# Patient Record
Sex: Female | Born: 1975 | Race: White | Hispanic: No | Marital: Married | Smoking: Former smoker
Health system: Southern US, Community
[De-identification: ages and names within clinical notes are randomized; demographics above are authoritative.]

## PROBLEM LIST (undated history)

## (undated) DIAGNOSIS — F419 Anxiety disorder, unspecified: Secondary | ICD-10-CM

## (undated) DIAGNOSIS — O24419 Gestational diabetes mellitus in pregnancy, unspecified control: Secondary | ICD-10-CM

## (undated) DIAGNOSIS — O09529 Supervision of elderly multigravida, unspecified trimester: Secondary | ICD-10-CM

## (undated) DIAGNOSIS — M329 Systemic lupus erythematosus, unspecified: Secondary | ICD-10-CM

## (undated) DIAGNOSIS — IMO0002 Reserved for concepts with insufficient information to code with codable children: Secondary | ICD-10-CM

## (undated) DIAGNOSIS — I10 Essential (primary) hypertension: Secondary | ICD-10-CM

## (undated) HISTORY — DX: Gestational diabetes mellitus in pregnancy, unspecified control: O24.419

## (undated) HISTORY — PX: NO PAST SURGERIES: SHX2092

## (undated) HISTORY — PX: WISDOM TOOTH EXTRACTION: SHX21

---

## 2009-03-20 ENCOUNTER — Ambulatory Visit: Payer: Self-pay | Admitting: General Practice

## 2009-05-16 ENCOUNTER — Ambulatory Visit: Payer: Self-pay | Admitting: General Practice

## 2010-06-05 ENCOUNTER — Inpatient Hospital Stay: Payer: Self-pay | Admitting: Obstetrics and Gynecology

## 2014-07-27 DIAGNOSIS — O09529 Supervision of elderly multigravida, unspecified trimester: Secondary | ICD-10-CM

## 2014-07-27 HISTORY — DX: Supervision of elderly multigravida, unspecified trimester: O09.529

## 2015-02-18 LAB — OB RESULTS CONSOLE RPR: RPR: NONREACTIVE

## 2015-02-18 LAB — OB RESULTS CONSOLE GC/CHLAMYDIA
Chlamydia: NEGATIVE
Gonorrhea: NEGATIVE

## 2015-02-18 LAB — OB RESULTS CONSOLE HEPATITIS B SURFACE ANTIGEN: HEP B S AG: NEGATIVE

## 2015-02-18 LAB — OB RESULTS CONSOLE VARICELLA ZOSTER ANTIBODY, IGG: VARICELLA IGG: IMMUNE

## 2015-02-18 LAB — OB RESULTS CONSOLE HIV ANTIBODY (ROUTINE TESTING): HIV: NONREACTIVE

## 2015-02-18 LAB — OB RESULTS CONSOLE RUBELLA ANTIBODY, IGM: RUBELLA: IMMUNE

## 2015-05-10 ENCOUNTER — Other Ambulatory Visit: Payer: Self-pay | Admitting: Certified Nurse Midwife

## 2015-05-10 DIAGNOSIS — O280 Abnormal hematological finding on antenatal screening of mother: Secondary | ICD-10-CM

## 2015-05-16 ENCOUNTER — Ambulatory Visit
Admission: RE | Admit: 2015-05-16 | Discharge: 2015-05-16 | Disposition: A | Discharge status: Home / Self Care | Payer: BLUE CROSS/BLUE SHIELD | Source: Ambulatory Visit | Attending: Maternal & Fetal Medicine | Admitting: Maternal & Fetal Medicine

## 2015-05-16 ENCOUNTER — Ambulatory Visit
Admission: RE | Admit: 2015-05-16 | Discharge: 2015-05-16 | Disposition: A | Discharge status: Home / Self Care | Payer: BLUE CROSS/BLUE SHIELD | Source: Ambulatory Visit | Attending: Certified Nurse Midwife | Admitting: Certified Nurse Midwife

## 2015-05-16 DIAGNOSIS — O289 Unspecified abnormal findings on antenatal screening of mother: Secondary | ICD-10-CM | POA: Diagnosis present

## 2015-05-16 DIAGNOSIS — Z3A18 18 weeks gestation of pregnancy: Secondary | ICD-10-CM | POA: Insufficient documentation

## 2015-05-16 DIAGNOSIS — O280 Abnormal hematological finding on antenatal screening of mother: Secondary | ICD-10-CM

## 2015-05-16 HISTORY — DX: Systemic lupus erythematosus, unspecified: M32.9

## 2015-05-16 HISTORY — DX: Supervision of elderly multigravida, unspecified trimester: O09.529

## 2015-05-16 HISTORY — DX: Reserved for concepts with insufficient information to code with codable children: IMO0002

## 2015-05-16 HISTORY — DX: Anxiety disorder, unspecified: F41.9

## 2015-05-16 NOTE — Progress Notes (Signed)
Referring Provider:  [facility] Length of consultation 25 minutes  [name] was referred for genetic counseling to discuss the results of her Maternal Serum Screen and her prenatal testing options.  This note is a summary of our discussion.  To review, a Maternal Serum AFP is a maternal blood test that measures maternal serum AFP levels to determine if a pregnancy is at higher risk for certain birth defects or problems; however, it cannot diagnose or rule out these conditions.  It is important to remember that an abnormal maternal serum screen (MSS) test does not necessarily mean that the baby has a problem.  Maternal serum screening can identify approximately 80% of open neural tube defects (i.e., spina bifida).  The neural tube consists of the fetal head and spine, and if this structure fails to close during development, spina bifida (open spine) or anencephaly (open skull) could result.   The MSS result revealed an increased level of AFP.  The value was 2.67 MOM (multiples of the median) which is above the cutoff of 2.50 MOM.  Increased levels of AFP occur for many reasons including: inaccurate pregnancy dating, the presence of twins, pregnancy complications, neural tube defects, abdominal wall defects or a normal pregnancy.  Before MSS screening, her risk to have a child with a neural tube defect was 1 or 2 per 1000. Given the elevated AFP value, the risk is now 1 in 209.  This means that the chance that this baby does not have a neural tube defect is >99.5%.  We discussed the following prenatal testing options for this pregnancy:   Ultrasound was recommended to evaluate the fetal anatomy, particularly the neural tube and to confirm the gestational age.  An ultrasound performed on 04/14/12 revealed that the patient was 18 weeks and 2 days pregnant.  There was no evidence of a neural tube defect on this ultrasound.  The fetal anatomy appeared normal, although this cannot rule out all  abnormalities.  Redraw AFP is offered when the level of AFP MoM does not exceed a particular laboratory cutoff.  Redrawing maternal blood for a repeat can provide patients with a better assessment of their risk to have a child with a neural tube defect.  Amniocentesis was offered because the AFP MoM exceeded a particular laboratory cutoff.  Amniocentesis can detect greater than 98% of neural tube defects.  It involves the removal of a small amount of amniotic fluid from the sac surrounding the fetus with the use of a thin needle through the maternal abdomen and uterus.  Fetal cells are directly evaluated and approximately 99% of chromosome conditions may also be detected.  Ultrasound guidance is used throughout the procedure. The main risks to this procedure include complications leading to miscarriage in less than 1 in 200 cases (0.5%).    It is important to remember that each pregnancy in the general population has a 2-3% chance for a birth defect that might not be detected prenatally.  We recommend 0.4mg  of folic acid, a B-complex vitamin, for all women of childbearing age.  Folic acid may help to prevent neural tube defects, and should be taken prior to and during pregnancy.  We inquired about the family history, and the patient reported no family members with known genetic conditions, mental retardation or birth defects.  In addition, [name]  reported no complications thus far and deny the use of prescription medications, recreational drugs, tobacco or alcohol. Of note, the patient is also of advanced maternal age (39  years old).  She had prior cell free fetal DNA testing in this pregnancy that was normal, showing no increased risk for trisomy 13, 18 or 21.  We reviewed these results; the patient had no further questions regarding aneuploidy screening or testing.  After careful consideration, [name] elected to have an ultrasound only and to decline additional screening and testing  options.  Unexplained elevations of maternal serum AFP have been associated with adverse pregnancy outcomes such as low birth weight, preterm labor, pre-eclampsia or fetal demise.  For this reason, another ultrasound should be considered at 26 to [redacted] weeks gestation to reassess fetal growth.  If further questions or concerns arise, please do not hesitate to call us at [phone].

## 2015-05-16 NOTE — Progress Notes (Signed)
Genetic counseling recommendations and assessment reviewed with [facility], I agree with her consultation as outlined.

## 2015-07-28 NOTE — L&D Delivery Note (Signed)
Deliver Note   Date of Delivery:   09/25/2015 Primary OB:   [facility] Gestational Age/EDD: [redacted]w[redacted]d by 10/15/2015, by Last Menstrual Period  Antepartum complications:  OB History    Gravida Para Term Preterm AB TAB SAB Ectopic Multiple Living   2 1 1       1       Delivered By:   Malachy Mood MD  Delivery Type:   TSVD Anesthesia:     Epidural  Intrapartum complications:  GBS:    Unknown Laceration:    None Episiotomy:    none Placenta:    Spontaneous Estimated Blood Loss:  218mL Baby:    Liveborn female , APGAR (1 MIN):   APGAR (5 MINS):   APGAR (10 MINS):  , weight     Deliver Details   At 06:19 a female infant was delivered via TSVD (Presentation: straight OA   ).  APGAR: 8, 9; weight  pending.   Placenta status: manual extraction, intact.  Cord: 3 vessel with the following complications: adherent placenta requiring manual extraction, intact on inspection, known circumvallate placenta on ultrasound, GDM .    Mom to postpartum.  Baby to Couplet care / Skin to Skin.

## 2015-08-15 ENCOUNTER — Encounter: Payer: BLUE CROSS/BLUE SHIELD | Attending: Obstetrics and Gynecology | Admitting: *Deleted

## 2015-08-15 ENCOUNTER — Encounter: Payer: Self-pay | Admitting: *Deleted

## 2015-08-15 VITALS — BP 108/58 | Ht 64.0 in | Wt 195.6 lb

## 2015-08-15 DIAGNOSIS — O24410 Gestational diabetes mellitus in pregnancy, diet controlled: Secondary | ICD-10-CM

## 2015-08-15 DIAGNOSIS — O24419 Gestational diabetes mellitus in pregnancy, unspecified control: Secondary | ICD-10-CM | POA: Insufficient documentation

## 2015-08-15 NOTE — Patient Instructions (Signed)
Read booklet on Gestational Diabetes Follow Gestational Meal Planning Guidelines Complete a 3 Day Food Record and bring to next appointment Avoid fruit juices and sugar sweetened drinks Check blood sugars 4 x day - before breakfast and 2 hrs after every meal and record  Bring blood sugar log to all appointments Purchase urine ketone strips if blood sugars not controlled and check urine ketones every am:  If + increase bedtime snack to 1 protein and 2 carbohydrate servings Walk 20-30 minutes at least 5 x week if permitted by MD Quit smoking

## 2015-08-16 NOTE — Progress Notes (Signed)
Diabetes Self-Management Education  Visit Type: First/Initial  Appt. Start Time: 1450  Appt. End Time: [id number]  08/16/2015  [name], identified by name and date of birth, is a 40 y.o. female with a diagnosis of Diabetes: Gestational Diabetes.   ASSESSMENT  Blood pressure 108/58, height 5\' 4"  (1.626 m), weight 195 lb 9.6 oz (88.724 kg), last menstrual period 01/08/2015. Body mass index is 33.56 kg/(m^2).      Diabetes Self-Management Education - 08/15/15 1713    Visit Information   Visit Type First/Initial   Initial Visit   Diabetes Type Gestational Diabetes   Are you currently following a meal plan? No   Are you taking your medications as prescribed? Yes   Date Diagnosed 08/08/15   Health Coping   How would you rate your overall health? Excellent   Psychosocial Assessment   Patient Belief/Attitude about Diabetes Motivated to manage diabetes  "I am not going to stress about it"   Self-care barriers None   Self-management support Doctor's office;Family   Patient Concerns Nutrition/Meal planning;Monitoring;Glycemic Control;Problem Solving   Special Needs None   Preferred Learning Style Auditory;Visual;Hands on   Learning Readiness Ready   How often do you need to have someone help you when you read instructions, pamphlets, or other written materials from your doctor or pharmacy? 1 - Never   What is the last grade level you completed in school? [id number]   Complications   How often do you check your blood sugar? 0 times/day (not testing)  Pt brought Accu-Chek Aviva Connect meter and was instructed on use. BG upon return demonstration was 82 mg/dL at 4:45 pm - 4 1/2 hrs pp.   Have you had a dilated eye exam in the past 12 months? Yes   Have you had a dental exam in the past 12 months? Yes   Are you checking your feet? No   Dietary Intake   Breakfast biscuit   Lunch sandwich, salads   Snack (afternoon) crackers, apple   Dinner meat and vegetables   Beverage(s) orange juice,  water, unsweetened drinks   Exercise   Exercise Type ADL's   Patient Education   Previous Diabetes Education No   Disease state  Definition of diabetes, type 1 and 2, and the diagnosis of diabetes   Nutrition management  Role of diet in the treatment of diabetes and the relationship between the three main macronutrients and blood glucose level   Physical activity and exercise  Role of exercise on diabetes management, blood pressure control and cardiac health.   Monitoring Taught/evaluated SMBG meter.;Purpose and frequency of SMBG.;Identified appropriate SMBG and/or A1C goals. Ketone testing, when, how.   Chronic complications Relationship between chronic complications and blood glucose control   Psychosocial adjustment Identified and addressed patients feelings and concerns about diabetes   Preconception care Pregnancy and GDM  Role of pre-pregnancy blood glucose control on the development of the fetus;Role of family planning for patients with diabetes;Reviewed with patient blood glucose goals with pregnancy   Personal strategies to promote health Review risk of smoking and offered smoking cessation   Individualized Goals (developed by patient)   Reducing Risk Improve blood sugars Prevent diabetess complications   Outcomes   Expected Outcomes Demonstrated interest in learning. Expect positive outcomes      Individualized Plan for Diabetes Self-Management Training:   Learning Objective:  Patient will have a greater understanding of diabetes self-management. Patient education plan is to attend individual and/or group sessions per assessed needs and concerns.  Plan:   Patient Instructions  Read booklet on Gestational Diabetes Follow Gestational Meal Planning Guidelines Complete a 3 Day Food Record and bring to next appointment Avoid fruit juices and sugar sweetened drinks Check blood sugars 4 x day - before breakfast and 2 hrs after every meal and record  Bring blood sugar log to all  appointments Purchase urine ketone strips if blood sugars not controlled and check urine ketones every am:  If + increase bedtime snack to 1 protein and 2 carbohydrate servings Walk 20-30 minutes at least 5 x week if permitted by MD Quit smoking  Expected Outcomes:  Demonstrated interest in learning. Expect positive outcomes  Education material provided:  Gestational Booklet Gestational Meal Planning Guidelines Viewed Gestational Diabetes Video 3 Day Food Record Goals for a Healthy Pregnancy  If problems or questions, patient to contact team via:   Johny Drilling, [place], [place], CDE [phone]  Future DSME appointment: Monday August 26, 2015 at 9:30 am with dietitian

## 2015-08-26 ENCOUNTER — Encounter: Payer: BLUE CROSS/BLUE SHIELD | Admitting: Dietician

## 2015-08-26 VITALS — BP 110/56 | Ht 64.0 in | Wt 196.9 lb

## 2015-08-26 DIAGNOSIS — O24410 Gestational diabetes mellitus in pregnancy, diet controlled: Secondary | ICD-10-CM

## 2015-08-26 DIAGNOSIS — O24419 Gestational diabetes mellitus in pregnancy, unspecified control: Secondary | ICD-10-CM | POA: Diagnosis not present

## 2015-08-26 NOTE — Patient Instructions (Signed)
Continue with current eating pattern. Call as needed with any questions or concerns.

## 2015-08-26 NOTE — Progress Notes (Signed)
   Patient's BG record indicates BGs generally within goal ranges; 3 post-meal readings above 120 (highes 136) which patient relates to eating fried foods.   Patient's food diary indicates well-balanced meals including protein, controlled amounts of carbohydrates, and some low-carb vegetables.     She reports some cravings for sweets but has worked to limit.    Provided 1800kcal meal plan, and wrote individualized menus based on patient's food preferences.  Instructed patient on food safety, including avoidance of Listeriosis, and limiting mercury from fish.  Discussed importance of maintaining healthy lifestyle habits to reduce risk of Type 2 DM as well as Gestational DM with any future pregnancies.  Advised patient to use any remaining testing supplies to test some BGs after delivery, and to have BG tested ideally annually, as well as prior to attempting future pregnancies.

## 2015-09-24 ENCOUNTER — Inpatient Hospital Stay: Admit: 2015-09-24 | Payer: BLUE CROSS/BLUE SHIELD | Admitting: Obstetrics and Gynecology

## 2015-09-24 ENCOUNTER — Inpatient Hospital Stay: Payer: BLUE CROSS/BLUE SHIELD | Admitting: Anesthesiology

## 2015-09-24 ENCOUNTER — Inpatient Hospital Stay
Admission: EM | Admit: 2015-09-24 | Discharge: 2015-09-26 | DRG: 774 | Disposition: A | Discharge status: Home / Self Care | Payer: BLUE CROSS/BLUE SHIELD | Attending: Obstetrics and Gynecology | Admitting: Obstetrics and Gynecology

## 2015-09-24 DIAGNOSIS — Z6834 Body mass index (BMI) 34.0-34.9, adult: Secondary | ICD-10-CM | POA: Diagnosis not present

## 2015-09-24 DIAGNOSIS — O99214 Obesity complicating childbirth: Secondary | ICD-10-CM | POA: Diagnosis present

## 2015-09-24 DIAGNOSIS — Z3A37 37 weeks gestation of pregnancy: Secondary | ICD-10-CM

## 2015-09-24 DIAGNOSIS — O4290 Premature rupture of membranes, unspecified as to length of time between rupture and onset of labor, unspecified weeks of gestation: Secondary | ICD-10-CM | POA: Diagnosis present

## 2015-09-24 DIAGNOSIS — O99334 Smoking (tobacco) complicating childbirth: Secondary | ICD-10-CM | POA: Diagnosis present

## 2015-09-24 DIAGNOSIS — E669 Obesity, unspecified: Secondary | ICD-10-CM | POA: Diagnosis present

## 2015-09-24 DIAGNOSIS — F17200 Nicotine dependence, unspecified, uncomplicated: Secondary | ICD-10-CM | POA: Diagnosis present

## 2015-09-24 DIAGNOSIS — O4292 Full-term premature rupture of membranes, unspecified as to length of time between rupture and onset of labor: Secondary | ICD-10-CM | POA: Diagnosis present

## 2015-09-24 DIAGNOSIS — O24420 Gestational diabetes mellitus in childbirth, diet controlled: Secondary | ICD-10-CM | POA: Diagnosis present

## 2015-09-24 DIAGNOSIS — O280 Abnormal hematological finding on antenatal screening of mother: Secondary | ICD-10-CM

## 2015-09-24 LAB — CBC
HCT: 31.7 % — ABNORMAL LOW (ref 35.0–47.0)
Hemoglobin: 10.7 g/dL — ABNORMAL LOW (ref 12.0–16.0)
MCH: 31.7 pg (ref 26.0–34.0)
MCHC: 33.7 g/dL (ref 32.0–36.0)
MCV: 93.9 fL (ref 80.0–100.0)
PLATELETS: 270 10*3/uL (ref 150–440)
RBC: 3.37 MIL/uL — ABNORMAL LOW (ref 3.80–5.20)
RDW: 15.2 % — AB (ref 11.5–14.5)
WBC: 10.3 10*3/uL (ref 3.6–11.0)

## 2015-09-24 LAB — ABO/RH: ABO/RH(D): O POS

## 2015-09-24 LAB — TYPE AND SCREEN
ABO/RH(D): O POS
Antibody Screen: NEGATIVE

## 2015-09-24 MED ORDER — KETOROLAC TROMETHAMINE 30 MG/ML IJ SOLN
30.0000 mg | Freq: Four times a day (QID) | INTRAMUSCULAR | Status: DC | PRN
  Administered 2015-09-25: 30 mg via INTRAVENOUS
  Filled 2015-09-24: qty 1

## 2015-09-24 MED ORDER — OXYCODONE-ACETAMINOPHEN 5-325 MG PO TABS: 2.0000 | ORAL_TABLET | ORAL | Status: DC | PRN

## 2015-09-24 MED ORDER — FENTANYL 2.5 MCG/ML W/ROPIVACAINE 0.2% IN NS 100 ML EPIDURAL INFUSION (ARMC-ANES)
EPIDURAL | Status: AC
  Filled 2015-09-24: qty 100

## 2015-09-24 MED ORDER — LIDOCAINE-EPINEPHRINE (PF) 1.5 %-1:200000 IJ SOLN
INTRAMUSCULAR | Status: DC | PRN
  Administered 2015-09-24: 3 mL via EPIDURAL

## 2015-09-24 MED ORDER — KETOROLAC TROMETHAMINE 30 MG/ML IJ SOLN: 30.0000 mg | Freq: Four times a day (QID) | INTRAMUSCULAR | Status: DC | PRN

## 2015-09-24 MED ORDER — PENICILLIN G POTASSIUM 5000000 UNITS IJ SOLR
5.0000 10*6.[IU] | Freq: Once | INTRAVENOUS | Status: AC
  Administered 2015-09-24: 5 10*6.[IU] via INTRAVENOUS
  Filled 2015-09-24: qty 5

## 2015-09-24 MED ORDER — TERBUTALINE SULFATE 1 MG/ML IJ SOLN: 0.2500 mg | Freq: Once | INTRAMUSCULAR | Status: DC | PRN

## 2015-09-24 MED ORDER — FENTANYL 2.5 MCG/ML W/ROPIVACAINE 0.2% IN NS 100 ML EPIDURAL INFUSION (ARMC-ANES)
10.0000 mL/h | EPIDURAL | Status: DC
  Administered 2015-09-24: 10 mL/h via EPIDURAL

## 2015-09-24 MED ORDER — BUTORPHANOL TARTRATE 1 MG/ML IJ SOLN: 1.0000 mg | INTRAMUSCULAR | Status: DC | PRN

## 2015-09-24 MED ORDER — SODIUM CHLORIDE 0.9% FLUSH: 3.0000 mL | INTRAVENOUS | Status: DC | PRN

## 2015-09-24 MED ORDER — SODIUM CHLORIDE FLUSH 0.9 % IV SOLN
INTRAVENOUS | Status: AC
  Filled 2015-09-24: qty 20

## 2015-09-24 MED ORDER — DIPHENHYDRAMINE HCL 25 MG PO CAPS: 25.0000 mg | ORAL_CAPSULE | ORAL | Status: DC | PRN

## 2015-09-24 MED ORDER — LACTATED RINGERS IV SOLN
INTRAVENOUS | Status: DC
  Administered 2015-09-24 (×3): via INTRAVENOUS

## 2015-09-24 MED ORDER — OXYCODONE-ACETAMINOPHEN 5-325 MG PO TABS
1.0000 | ORAL_TABLET | ORAL | Status: DC | PRN
  Administered 2015-09-25: 1 via ORAL
  Filled 2015-09-24: qty 1

## 2015-09-24 MED ORDER — DEXTROSE 5 % IV SOLN
1.0000 ug/kg/h | INTRAVENOUS | Status: DC | PRN
  Filled 2015-09-24: qty 2

## 2015-09-24 MED ORDER — NALBUPHINE HCL 10 MG/ML IJ SOLN: 5.0000 mg | INTRAMUSCULAR | Status: DC | PRN

## 2015-09-24 MED ORDER — SODIUM CHLORIDE 0.9 % IV SOLN
INTRAVENOUS | Status: DC | PRN
  Administered 2015-09-24 (×2): 4 mL via EPIDURAL

## 2015-09-24 MED ORDER — ONDANSETRON HCL 4 MG/2ML IJ SOLN: 4.0000 mg | Freq: Three times a day (TID) | INTRAMUSCULAR | Status: DC | PRN

## 2015-09-24 MED ORDER — MEPERIDINE HCL 25 MG/ML IJ SOLN: 6.2500 mg | INTRAMUSCULAR | Status: DC | PRN

## 2015-09-24 MED ORDER — NALBUPHINE HCL 10 MG/ML IJ SOLN: 5.0000 mg | Freq: Once | INTRAMUSCULAR | Status: DC | PRN

## 2015-09-24 MED ORDER — OXYTOCIN 40 UNITS IN LACTATED RINGERS INFUSION - SIMPLE MED
2.5000 [IU]/h | INTRAVENOUS | Status: DC
  Filled 2015-09-24: qty 1000

## 2015-09-24 MED ORDER — NALOXONE HCL 0.4 MG/ML IJ SOLN: 0.4000 mg | INTRAMUSCULAR | Status: DC | PRN

## 2015-09-24 MED ORDER — OXYTOCIN BOLUS FROM INFUSION: 500.0000 mL | INTRAVENOUS | Status: DC

## 2015-09-24 MED ORDER — ACETAMINOPHEN 325 MG PO TABS: 650.0000 mg | ORAL_TABLET | ORAL | Status: DC | PRN

## 2015-09-24 MED ORDER — LACTATED RINGERS IV SOLN
500.0000 mL | INTRAVENOUS | Status: DC | PRN
  Administered 2015-09-24: 500 mL via INTRAVENOUS

## 2015-09-24 MED ORDER — LIDOCAINE HCL (PF) 1 % IJ SOLN
30.0000 mL | INTRAMUSCULAR | Status: DC | PRN
  Filled 2015-09-24: qty 30

## 2015-09-24 MED ORDER — OXYTOCIN 40 UNITS IN LACTATED RINGERS INFUSION - SIMPLE MED
1.0000 m[IU]/min | INTRAVENOUS | Status: DC
  Administered 2015-09-24: 1 m[IU]/min via INTRAVENOUS

## 2015-09-24 MED ORDER — ONDANSETRON HCL 4 MG/2ML IJ SOLN
4.0000 mg | Freq: Four times a day (QID) | INTRAMUSCULAR | Status: DC | PRN
Start: 2015-09-24 — End: 2015-09-25

## 2015-09-24 MED ORDER — DIPHENHYDRAMINE HCL 50 MG/ML IJ SOLN: 12.5000 mg | INTRAMUSCULAR | Status: DC | PRN

## 2015-09-24 MED ORDER — PENICILLIN G POTASSIUM 5000000 UNITS IJ SOLR
2.5000 10*6.[IU] | INTRAVENOUS | Status: DC
  Administered 2015-09-25: 2.5 10*6.[IU] via INTRAVENOUS
  Filled 2015-09-24 (×7): qty 2.5

## 2015-09-24 NOTE — Anesthesia Preprocedure Evaluation (Signed)
Anesthesia Evaluation  Patient identified by MRN, date of birth, ID band Patient awake    Reviewed: Allergy & Precautions, H&P , NPO status , Patient's Chart, lab work & pertinent test results, reviewed documented beta blocker date and time   History of Anesthesia Complications Negative for: history of anesthetic complications  Airway Mallampati: III  TM Distance: >3 FB Neck ROM: full    Dental no notable dental hx. (+) Teeth Intact   Pulmonary neg shortness of breath, neg sleep apnea, neg recent URI, Current Smoker,    Pulmonary exam normal breath sounds clear to auscultation       Cardiovascular Exercise Tolerance: Good negative cardio ROS Normal cardiovascular exam Rhythm:regular Rate:Normal     Neuro/Psych PSYCHIATRIC DISORDERS (Anxiety) negative neurological ROS     GI/Hepatic negative GI ROS, Neg liver ROS,   Endo/Other  diabetes (gestational)  Renal/GU negative Renal ROS  negative genitourinary   Musculoskeletal   Abdominal   Peds  Hematology negative hematology ROS (+)   Anesthesia Other Findings Past Medical History:   Lupus ([place])                                     25 years *   AMA (advanced maternal age) multigravida 35+    2016         Anxiety                                                      Gestational diabetes                                         Reproductive/Obstetrics (+) Pregnancy                             Anesthesia Physical Anesthesia Plan  ASA: II  Anesthesia Plan: Epidural   Post-op Pain Management:    Induction:   Airway Management Planned:   Additional Equipment:   Intra-op Plan:   Post-operative Plan:   Informed Consent: I have reviewed the patients History and Physical, chart, labs and discussed the procedure including the risks, benefits and alternatives for the proposed anesthesia with the patient or authorized representative who has  indicated his/her understanding and acceptance.   Dental Advisory Given  Plan Discussed with: Anesthesiologist, CRNA and Surgeon  Anesthesia Plan Comments:         Anesthesia Quick Evaluation

## 2015-09-24 NOTE — Progress Notes (Signed)
Subjective:  Doing well no concerns, feeling slightly more intense contractions, still bearable   Objective:   Vitals: Blood pressure 116/81, pulse 101, temperature 98.4 F (36.9 C), temperature source Oral, resp. rate 16, height 5\' 4"  (1.626 m), weight 91.173 kg (201 lb), last menstrual period 01/08/2015. General: NAD Abdomen: gravid, soft, non-tender Cervical Exam:  Dilation: 1.5 Effacement (%): 50 Cervical Position: Posterior Station: -3 Presentation: Vertex Exam by:: Clydia Llano, RN  FHT: 130, moderate variability, + accels, no decels Toco: q8min  Results for orders placed or performed during the hospital encounter of 09/24/15 (from the past 24 hour(s))  CBC     Status: Abnormal   Collection Time: 09/24/15 10:52 AM  Result Value Ref Range   WBC 10.3 3.6 - 11.0 K/uL   RBC 3.37 (L) 3.80 - 5.20 MIL/uL   Hemoglobin 10.7 (L) 12.0 - 16.0 g/dL   HCT 31.7 (L) 35.0 - 47.0 %   MCV 93.9 80.0 - 100.0 fL   MCH 31.7 26.0 - 34.0 pg   MCHC 33.7 32.0 - 36.0 g/dL   RDW 15.2 (H) 11.5 - 14.5 %   Platelets 270 150 - 440 K/uL  Type and screen [place]     Status: None   Collection Time: 09/24/15 10:52 AM  Result Value Ref Range   ABO/RH(D) O POS    Antibody Screen NEG    Sample Expiration 09/27/2015   ABO/Rh     Status: None   Collection Time: 09/24/15 10:53 AM  Result Value Ref Range   ABO/RH(D) O POS     Assessment:   40 y.o. G2P1001 [redacted]w[redacted]d PROM  Plan:   1) Labor - continue pitocin augmentation  2) Fetus - category I tracing

## 2015-09-24 NOTE — Anesthesia Procedure Notes (Signed)
Epidural Patient location during procedure: OB Start time: 09/24/2015 10:10 PM End time: 09/24/2015 10:18 PM  Staffing Anesthesiologist: Martha Clan Performed by: anesthesiologist   Preanesthetic Checklist Completed: patient identified, site marked, surgical consent, pre-op evaluation, timeout performed, IV checked, risks and benefits discussed and monitors and equipment checked  Epidural Patient position: sitting Prep: Betadine Patient monitoring: heart rate, continuous pulse ox and blood pressure Approach: midline Location: L3-L4 Injection technique: LOR saline  Needle:  Needle type: Tuohy  Needle gauge: 17 G Needle length: 9 cm and 9 Needle insertion depth: 5 cm Catheter type: closed end flexible Catheter size: 19 Gauge Catheter at skin depth: 10 cm Test dose: negative and 1.5% lidocaine with Epi 1:200 K  Assessment Events: blood not aspirated, injection not painful, no injection resistance, negative IV test and no paresthesia  Additional Notes   Patient tolerated the insertion well without complications.Reason for block:procedure for pain

## 2015-09-24 NOTE — OB Triage Note (Signed)
[name] here with report of SROM at 0645, clear fluid, denies feeling ctx, positive fetal movement. States she has diet controlled GDM.

## 2015-09-24 NOTE — H&P (Signed)
OB History & Physical   History of Present Illness:  Chief Complaint: Leakage of fluid  HPI:  Vanessa Bryan is a 40 y.o. G2P1001 female at [redacted]w[redacted]d dated by LMP.  Her pregnancy has been complicated by anxiety, obesity, anterior circumvallate placenta, diet controlled GDM.    She denies contractions.   She reports leakage of fluid.   She denies vaginal bleeding.   She reports fetal movement.    Maternal Medical History:   Past Medical History  Diagnosis Date  . Lupus ([place]) 40 years old  . AMA (advanced maternal age) multigravida 35+ 2016  . Anxiety   . Gestational diabetes     Past Surgical History  Procedure Laterality Date  . No past surgeries      No Known Allergies  Prior to Admission medications   Medication Sig Start Date End Date Taking? Authorizing Provider  cetirizine (ZYRTEC) 10 MG tablet Take 10 mg by mouth daily.    Historical Provider, MD  ferrous gluconate (FERGON) 324 MG tablet Take 324 mg by mouth daily with breakfast.    Historical Provider, MD  Prenatal Vit-Fe Fumarate-FA (PRENATAL MULTIVITAMIN) TABS tablet Take 1 tablet by mouth daily at 12 noon.    Historical Provider, MD    OB History  Gravida Para Term Preterm AB SAB TAB Ectopic Multiple Living  2 1 1       1     # Outcome Date GA Lbr Len/2nd Weight Sex Delivery Anes PTL Lv  2 Current           1 Term 06/05/10    F Vag-Spont  N Y      Prenatal care site: [facility]  Social History: She  reports that she has been smoking E-cigarettes.  She uses smokeless tobacco. She reports that she does not drink alcohol or use illicit drugs.  Family History: family history is not on file.   Review of Systems: Negative x 10 systems reviewed except as noted in the HPI.    Physical Exam:  Vital Signs: BP 117/75 mmHg  Pulse 104  Temp(Src) 98 F (36.7 C) (Oral)  Resp 16  Ht 5\' 4"  (1.626 m)  Wt 91.173 kg (201 lb)  BMI 34.48 kg/m2  LMP 01/08/2015 General: no acute distress.  HEENT: normocephalic,  atraumatic Heart: regular rate & rhythm.  No murmurs/rubs/gallops Lungs: clear to auscultation bilaterally Abdomen: soft, gravid, non-tender;  EFW: 7.5 pounds Pelvic:   External: Normal external female genitalia  Cervix: Dilation: 1.5 / Effacement (%): 50 / Station: -3   ROM:  +nitrazine Extremities: non-tender, symmetric, No edema bilaterally.  DTRs: 2+  Neurologic: Alert & oriented x 3.    Pertinent Results:  Prenatal Labs: Blood type/Rh O positive  Antibody screen negative  Rubella Immune  Varicella Immune    RPR NR  HBsAg negative  HIV negative  GC negative  Chlamydia negative  Genetic screening Positive MSAFP DP consult normal anatomy scan  1 hour GTT 143 07/30/15  3 hour GTT 73, 216, 139, 50  GBS Unknown- screened on 2/27   Baseline FHR: 135 beats/min   Variability: moderate   Accelerations: present   Decelerations: absent Contractions: present frequency: occasional Overall assessment: Category I   Assessment:  Vanessa Bryan is a 40 y.o. G24P1001 female at [redacted]w[redacted]d with premature rupture of membranes at [facility] 2/28.   Plan:  1. Admit to Labor & Delivery  2. CBC, T&S, Clrs, IVF 3. GBS unknown, treat at >18 hrs ruptured.  4. Fetal well-being: Category I 5.   Pitocin augmentation 6.   Epidural as desired  Andron Marrazzo, CNM  This patient and plan were discussed with Dr Georgianne Fick 09/24/2015

## 2015-09-24 NOTE — Progress Notes (Signed)
Subjective:  Contractions more regular and stronger, having to breath through now  Objective:   Vitals: Blood pressure 116/81, pulse 101, temperature 98.4 F (36.9 C), temperature source Oral, resp. rate 16, height 5\' 4"  (1.626 m), weight 91.173 kg (201 lb), last menstrual period 01/08/2015. General: NAD Abdomen:gravid non-tender Cervical Exam:  Dilation: 3 Effacement (%): 50 Cervical Position: Posterior Station: -2 Presentation: Vertex Exam by:: AS MD  FHT: 140, moderate, positive accels, no decels Toco:q42min  Results for orders placed or performed during the hospital encounter of 09/24/15 (from the past 24 hour(s))  CBC     Status: Abnormal   Collection Time: 09/24/15 10:52 AM  Result Value Ref Range   WBC 10.3 3.6 - 11.0 K/uL   RBC 3.37 (L) 3.80 - 5.20 MIL/uL   Hemoglobin 10.7 (L) 12.0 - 16.0 g/dL   HCT 31.7 (L) 35.0 - 47.0 %   MCV 93.9 80.0 - 100.0 fL   MCH 31.7 26.0 - 34.0 pg   MCHC 33.7 32.0 - 36.0 g/dL   RDW 15.2 (H) 11.5 - 14.5 %   Platelets 270 150 - 440 K/uL  Type and screen Portal     Status: None   Collection Time: 09/24/15 10:52 AM  Result Value Ref Range   ABO/RH(D) O POS    Antibody Screen NEG    Sample Expiration 09/27/2015   ABO/Rh     Status: None   Collection Time: 09/24/15 10:53 AM  Result Value Ref Range   ABO/RH(D) O POS     Assessment:   40 y.o. G2P1001 [redacted]w[redacted]d augmentation for PROM  Plan:   1) Labor - continue pitocin  2) Fetus - cat I tracing  3) GBS unknown - given rate of change, currently 12-hrs ruptured, believe patient will cross the 18-hr treshold for starting abx for GBS unknown ppx.  Will start now

## 2015-09-25 LAB — RPR: RPR Ser Ql: NONREACTIVE

## 2015-09-25 MED ORDER — SENNOSIDES-DOCUSATE SODIUM 8.6-50 MG PO TABS
2.0000 | ORAL_TABLET | ORAL | Status: DC
  Administered 2015-09-26: 2 via ORAL
  Filled 2015-09-25: qty 2

## 2015-09-25 MED ORDER — ACETAMINOPHEN 325 MG PO TABS: 650.0000 mg | ORAL_TABLET | ORAL | Status: DC | PRN

## 2015-09-25 MED ORDER — BENZOCAINE-MENTHOL 20-0.5 % EX AERO: 1.0000 "application " | INHALATION_SPRAY | CUTANEOUS | Status: DC | PRN

## 2015-09-25 MED ORDER — OXYCODONE-ACETAMINOPHEN 5-325 MG PO TABS: 2.0000 | ORAL_TABLET | ORAL | Status: DC | PRN

## 2015-09-25 MED ORDER — SIMETHICONE 80 MG PO CHEW: 80.0000 mg | CHEWABLE_TABLET | ORAL | Status: DC | PRN

## 2015-09-25 MED ORDER — ONDANSETRON HCL 4 MG/2ML IJ SOLN: 4.0000 mg | INTRAMUSCULAR | Status: DC | PRN

## 2015-09-25 MED ORDER — LANOLIN HYDROUS EX OINT: TOPICAL_OINTMENT | CUTANEOUS | Status: DC | PRN

## 2015-09-25 MED ORDER — WITCH HAZEL-GLYCERIN EX PADS
1.0000 | MEDICATED_PAD | CUTANEOUS | Status: DC | PRN
Start: 2015-09-25 — End: 2015-09-26

## 2015-09-25 MED ORDER — OXYCODONE-ACETAMINOPHEN 5-325 MG PO TABS
1.0000 | ORAL_TABLET | ORAL | Status: DC | PRN
  Administered 2015-09-25 – 2015-09-26 (×3): 1 via ORAL
  Filled 2015-09-25 (×4): qty 1

## 2015-09-25 MED ORDER — DIBUCAINE 1 % RE OINT: 1.0000 "application " | TOPICAL_OINTMENT | RECTAL | Status: DC | PRN

## 2015-09-25 MED ORDER — DIPHENHYDRAMINE HCL 25 MG PO CAPS: 25.0000 mg | ORAL_CAPSULE | Freq: Four times a day (QID) | ORAL | Status: DC | PRN

## 2015-09-25 MED ORDER — IBUPROFEN 600 MG PO TABS
600.0000 mg | ORAL_TABLET | Freq: Four times a day (QID) | ORAL | Status: DC
  Administered 2015-09-25 – 2015-09-26 (×5): 600 mg via ORAL
  Filled 2015-09-25 (×5): qty 1

## 2015-09-25 MED ORDER — ONDANSETRON HCL 4 MG PO TABS: 4.0000 mg | ORAL_TABLET | ORAL | Status: DC | PRN

## 2015-09-25 MED ORDER — PRENATAL MULTIVITAMIN CH
1.0000 | ORAL_TABLET | Freq: Every day | ORAL | Status: DC
  Administered 2015-09-25: 1 via ORAL
  Filled 2015-09-25: qty 1

## 2015-09-25 NOTE — Discharge Summary (Signed)
Obstetric Discharge Summary Reason for Admission: onset of labor and rupture of membranes Prenatal Procedures: none Intrapartum Procedures: spontaneous vaginal delivery, manual removal of placenta Postpartum Procedures: none and antibiotics Complications-Operative and Postpartum: none HEMOGLOBIN  Date Value Ref Range Status  09/24/2015 10.7* 12.0 - 16.0 g/dL Final   HCT  Date Value Ref Range Status  09/24/2015 31.7* 35.0 - 47.0 % Final   PP Hct: 28.9  Physical Exam:  General: alert, appears stated age and no distress Lochia: appropriate Uterine Fundus: firm DVT Evaluation: No evidence of DVT seen on physical exam.  Discharge Diagnoses: Term Pregnancy-delivered  Discharge Information: Date: 09/25/2015 Activity: pelvic rest Diet: routine Medications:    Medication List    TAKE these medications        cetirizine 10 MG tablet  Commonly known as:  ZYRTEC  Take 10 mg by mouth daily.     ferrous gluconate 324 MG tablet  Commonly known as:  FERGON  Take 324 mg by mouth daily with breakfast.     ibuprofen 600 MG tablet  Commonly known as:  ADVIL,MOTRIN  Take 1 tablet (600 mg total) by mouth every 6 (six) hours.     oxyCODONE-acetaminophen 5-325 MG tablet  Commonly known as:  PERCOCET/ROXICET  Take 1-2 tablets by mouth every 4 (four) hours as needed (pain scale 4-7).     prenatal multivitamin Tabs tablet  Take 1 tablet by mouth daily at 12 noon.        Condition: stable Discharge to: home   Newborn Data: Live born female  Birth Weight: 3180 (7# 0.2oz) Apgars: 8, 9  Home with mother.  STAEBLER, ANDREAS M 09/25/2015, 7:05 AM   Updated 09/26/15 by T. Guinevere Stephenson, CNM

## 2015-09-26 LAB — CBC
HCT: 28.9 % — ABNORMAL LOW (ref 35.0–47.0)
HEMOGLOBIN: 9.7 g/dL — AB (ref 12.0–16.0)
MCH: 31.9 pg (ref 26.0–34.0)
MCHC: 33.5 g/dL (ref 32.0–36.0)
MCV: 95.1 fL (ref 80.0–100.0)
PLATELETS: 234 10*3/uL (ref 150–440)
RBC: 3.04 MIL/uL — AB (ref 3.80–5.20)
RDW: 15.2 % — ABNORMAL HIGH (ref 11.5–14.5)
WBC: 10.1 10*3/uL (ref 3.6–11.0)

## 2015-09-26 LAB — SURGICAL PATHOLOGY

## 2015-09-26 MED ORDER — IBUPROFEN 600 MG PO TABS: 600.0000 mg | ORAL_TABLET | Freq: Four times a day (QID) | ORAL | Status: DC

## 2015-09-26 MED ORDER — OXYCODONE-ACETAMINOPHEN 5-325 MG PO TABS: 1.0000 | ORAL_TABLET | ORAL | Status: DC | PRN

## 2015-09-26 NOTE — Discharge Instructions (Signed)
Bleeding: Your bleeding could continue up to 6 weeks, the flow should gradually decrease and the color should become dark then lightened over the next couple of weeks. If you notice you are bleeding heavily or passing clots larger than the size of your fist, PLEASE call your physician. No TAMPONS, DOUCHING, ENEMAS OR SEXUAL INTERCOURSE for 6 weeks.  ° °Stitches: Shower daily with mild soap and water. Stitches will dissolve over the next couple of weeks, if you experience any discomfort in the vaginal area you may sit in warm water 15-20 minutes, 3-4 times per day. Just enough water to cover vaginal area.  ° °AfterPains: This is the uterus contracting back to its normal position and size. Use medications prescribed or recommended by your physician to help relieve this discomfort.  ° °Bowels/Hemorrhoids: Drink plenty of water and stay active. Increase fiber, fresh fruits and vegetables in your diet.  ° °Rest/Activity: Rest when the baby is resting ° °Bathing: Shower daily! ° °Diet: Continue to eat extra calories until your follow up visit to help replenish nutrients and vitamins. If breastfeeding eat an extra 500-1000 and increase your fluid intake to 12 glasses a day.  ° °Contraception: Consult with your physician on what method of birth control you would like to use.  ° °Postpartum "BLUES": It is common to emotional days after delivery, however if it persist for greater than 2 weeks or if you feel concerned please let your physician know immediately. This is hormone driven and nothing you can control so please let someone know how you feel. ° °Follow Up Visit: Please schedule a follow up visit with your physician.  °

## 2015-09-26 NOTE — Anesthesia Postprocedure Evaluation (Signed)
Anesthesia Post Note  Patient: Vanessa Bryan  Procedure(s) Performed: * No procedures listed *  Patient location during evaluation: Mother Baby Anesthesia Type: Epidural Level of consciousness: awake and alert Pain management: pain level controlled Vital Signs Assessment: post-procedure vital signs reviewed and stable Respiratory status: spontaneous breathing, nonlabored ventilation and respiratory function stable Cardiovascular status: stable Postop Assessment: no headache, no backache and epidural receding Anesthetic complications: no    Last Vitals:  Filed Vitals:   09/26/15 0334 09/26/15 0758  BP: 116/72 117/78  Pulse: 88 84  Temp: 36.6 C 36.7 C  Resp: 20 18    Last Pain:  Filed Vitals:   09/26/15 0758  PainSc: 2                  Alison Stalling

## 2015-09-26 NOTE — Progress Notes (Signed)
Patient understands all discharge instructions and the need to make follow up appointments. Patient discharge via wheelchair with auxillary. 

## 2015-09-26 NOTE — Lactation Note (Signed)
This note was copied from a baby's chart.  .vLactation Consultation Note  Patient Name: Vanessa Bryan Today's Date: 09/26/2015     Maternal Data   Talked with mother about giving the colostrum and supplementing after with formula until they transition to either all breast or all formula milk  Feeding    LATCH Score/Interventions                      Lactation Tools Discussed/Used     Consult Status      Daryel November 09/26/2015, 12:50 PM

## 2015-09-30 NOTE — Progress Notes (Signed)
History and recommendations reviewed, agree with assessment and plan as outlined  M.Olajuwon Fosdick. MD

## 2016-05-21 DIAGNOSIS — M5412 Radiculopathy, cervical region: Secondary | ICD-10-CM | POA: Insufficient documentation

## 2016-05-27 ENCOUNTER — Other Ambulatory Visit: Payer: Self-pay | Admitting: Physician Assistant

## 2016-05-27 DIAGNOSIS — M5412 Radiculopathy, cervical region: Secondary | ICD-10-CM

## 2016-06-09 ENCOUNTER — Ambulatory Visit
Admission: RE | Admit: 2016-06-09 | Discharge: 2016-06-09 | Disposition: A | Discharge status: Home / Self Care | Payer: BLUE CROSS/BLUE SHIELD | Source: Ambulatory Visit | Attending: Physician Assistant | Admitting: Physician Assistant

## 2016-06-09 DIAGNOSIS — M50222 Other cervical disc displacement at C5-C6 level: Secondary | ICD-10-CM | POA: Diagnosis not present

## 2016-06-09 DIAGNOSIS — M5412 Radiculopathy, cervical region: Secondary | ICD-10-CM | POA: Diagnosis present

## 2018-05-19 ENCOUNTER — Other Ambulatory Visit: Payer: Self-pay | Admitting: Physician Assistant

## 2018-05-19 DIAGNOSIS — Z1231 Encounter for screening mammogram for malignant neoplasm of breast: Secondary | ICD-10-CM

## 2018-05-24 ENCOUNTER — Ambulatory Visit
Admission: RE | Admit: 2018-05-24 | Discharge: 2018-05-24 | Disposition: A | Discharge status: Home / Self Care | Payer: BLUE CROSS/BLUE SHIELD | Source: Ambulatory Visit | Attending: Physician Assistant | Admitting: Physician Assistant

## 2018-05-24 DIAGNOSIS — Z1231 Encounter for screening mammogram for malignant neoplasm of breast: Secondary | ICD-10-CM | POA: Diagnosis present

## 2018-05-25 ENCOUNTER — Other Ambulatory Visit: Payer: Self-pay | Admitting: Physician Assistant

## 2018-05-25 DIAGNOSIS — N6310 Unspecified lump in the right breast, unspecified quadrant: Secondary | ICD-10-CM

## 2018-05-25 DIAGNOSIS — R928 Other abnormal and inconclusive findings on diagnostic imaging of breast: Secondary | ICD-10-CM

## 2018-06-08 ENCOUNTER — Ambulatory Visit
Admission: RE | Admit: 2018-06-08 | Discharge: 2018-06-08 | Disposition: A | Discharge status: Home / Self Care | Payer: BLUE CROSS/BLUE SHIELD | Source: Ambulatory Visit | Attending: Physician Assistant | Admitting: Physician Assistant

## 2018-06-08 DIAGNOSIS — N6310 Unspecified lump in the right breast, unspecified quadrant: Secondary | ICD-10-CM | POA: Diagnosis present

## 2018-06-08 DIAGNOSIS — R928 Other abnormal and inconclusive findings on diagnostic imaging of breast: Secondary | ICD-10-CM | POA: Insufficient documentation

## 2018-10-11 ENCOUNTER — Other Ambulatory Visit: Payer: Self-pay | Admitting: Internal Medicine

## 2018-10-11 DIAGNOSIS — N6310 Unspecified lump in the right breast, unspecified quadrant: Secondary | ICD-10-CM

## 2019-01-13 ENCOUNTER — Telehealth: Payer: Self-pay | Admitting: Internal Medicine

## 2019-01-13 NOTE — Telephone Encounter (Signed)
Spoke with [employer].  States S/Sx from 12/26/2018 haven't resolved.  Advised if she needs to see someone now, she needs to go to Urgent Care.  Also, offered to schedule an appointment for Tuesday.  Said she may call back on Monday, but then said she might just go to the Urgent Care.

## 2019-01-13 NOTE — Telephone Encounter (Signed)
Vanessa Bryan, please triage this message and direct her to an urgent care if she is very symptomatic.  Thanks,

## 2019-01-13 NOTE — Telephone Encounter (Signed)
°  1. Do you have a fever? no 2. Are you having chills? no 3. Do you have a sore throat? no 4. Are you experiencing resp S/Sx -cough, SOB? yes 5. Do you have muscle aches? no 6. Are you experiencing N/V/D no 7. Experiencing loss of sense of taste and/or smell? No  8. Do you have a headache? yes 9. Have you had contact with a person confirmed Positive for Covid-19? no 10. Have you traveled outside of [place]? No  Inspection dept.  She was seen 12-26-18 and was given augmentin.     25mths - symptoms including:  Drainage  Congestion  Green/brown mucus  Fluid in ears

## 2019-04-19 ENCOUNTER — Other Ambulatory Visit: Payer: Self-pay | Admitting: *Deleted

## 2019-04-19 ENCOUNTER — Encounter: Payer: Self-pay | Admitting: Registered Nurse

## 2019-04-19 ENCOUNTER — Telehealth: Payer: Self-pay | Admitting: Registered Nurse

## 2019-04-19 DIAGNOSIS — R6889 Other general symptoms and signs: Secondary | ICD-10-CM | POA: Diagnosis not present

## 2019-04-19 DIAGNOSIS — Z1239 Encounter for other screening for malignant neoplasm of breast: Secondary | ICD-10-CM

## 2019-04-19 DIAGNOSIS — Z20822 Contact with and (suspected) exposure to covid-19: Secondary | ICD-10-CM

## 2019-04-19 DIAGNOSIS — N63 Unspecified lump in unspecified breast: Secondary | ICD-10-CM

## 2019-04-19 NOTE — Telephone Encounter (Signed)
Chart review conducted patient overdue for repeat mammogram/us.  Telephone message left for her to call clinic.  Please verify if symptoms and if she is willing to go for her follow up mammogram/ultrasound and I will place order.  " CLINICAL DATA:  Patient was called back from screening mammogram for a possible mass in the right breast.  EXAM: DIGITAL DIAGNOSTIC RIGHT MAMMOGRAM WITH TOMO  ULTRASOUND RIGHT BREAST  COMPARISON:  Baseline screening mammogram dated 05/24/2018.  ACR Breast Density Category b: There are scattered areas of fibroglandular density.  FINDINGS: Additional imaging of the right breast was performed. There is persistence of a well-circumscribed 6 mm mass in the lateral aspect of the breast. There are no malignant type microcalcifications.  On physical exam, I do not palpate a mass lateral aspect of the right breast.  Targeted ultrasound is performed, showing normal tissue throughout the lateral aspect of the right breast. No solid or cystic mass, abnormal shadowing or distortion visualized.  IMPRESSION: Probable benign right breast mass.  RECOMMENDATION: Short-term interval follow-up right mammogram and possible ultrasound in 6 months is recommended.  I have discussed the findings and recommendations with the patient. Results were also provided in writing at the conclusion of the visit. If applicable, a reminder letter will be sent to the patient regarding the next appointment.  BI-RADS CATEGORY  3: Probably benign.   Electronically Signed   By: Lillia Mountain M.D.   On: 06/08/2018 15:37

## 2019-04-20 LAB — NOVEL CORONAVIRUS, NAA: SARS-CoV-2, NAA: NOT DETECTED

## 2019-04-21 NOTE — Telephone Encounter (Signed)
Found documentation in the chart where Dr. Roxan Hockey signed the order for follow-up mammogram on 10/11/2018 & order was faxed to [facility].  Vanessa Bryan was notified to schedule her appointment.  In June 2020, I followed up with Vanessa Bryan because we had not received any results.  On 01/16/2019 Vanessa Bryan's reply to me was "I did not set up an appointment.  I think I will wait until next year to follow up".  She has not contacted the office since [name] left her a voice mail on 04/18/3029.  I tried to call [name] & it went straight to voice mail.  Left call back message.  AMD

## 2019-04-22 NOTE — Telephone Encounter (Signed)
Patient returned call and notified RN Raenette Rover she would like to schedule f/u mammogram at [facility].  Electronic Orders entered and patient to schedule with [facility].

## 2019-04-22 NOTE — Addendum Note (Signed)
Addended by: Gerarda Fraction A on: 04/22/2019 03:43 PM   Modules accepted: Orders

## 2019-04-24 NOTE — Telephone Encounter (Signed)
Notified Kristinia to call [facility] to schedule her mammogram appointment & gave her their phone number.  AMD

## 2019-04-28 ENCOUNTER — Ambulatory Visit: Payer: 59

## 2019-04-28 ENCOUNTER — Other Ambulatory Visit: Payer: Self-pay

## 2019-04-28 DIAGNOSIS — Z01818 Encounter for other preprocedural examination: Secondary | ICD-10-CM

## 2019-04-28 LAB — POCT URINALYSIS DIPSTICK
Bilirubin, UA: NEGATIVE
Blood, UA: NEGATIVE
Glucose, UA: NEGATIVE
Ketones, UA: NEGATIVE
Leukocytes, UA: NEGATIVE
Nitrite, UA: NEGATIVE
Protein, UA: NEGATIVE
Spec Grav, UA: 1.025 (ref 1.010–1.025)
Urobilinogen, UA: 0.2 E.U./dL
pH, UA: 6 (ref 5.0–8.0)

## 2019-04-29 LAB — CMP12+LP+TP+TSH+6AC+CBC/D/PLT
ALT: 38 IU/L — ABNORMAL HIGH (ref 0–32)
AST: 32 IU/L (ref 0–40)
Albumin/Globulin Ratio: 1.9 (ref 1.2–2.2)
Albumin: 5 g/dL — ABNORMAL HIGH (ref 3.8–4.8)
Alkaline Phosphatase: 97 IU/L (ref 39–117)
BUN/Creatinine Ratio: 18 (ref 9–23)
BUN: 13 mg/dL (ref 6–24)
Basophils Absolute: 0.1 10*3/uL (ref 0.0–0.2)
Basos: 1 %
Bilirubin Total: 0.3 mg/dL (ref 0.0–1.2)
Calcium: 10.4 mg/dL — ABNORMAL HIGH (ref 8.7–10.2)
Chloride: 101 mmol/L (ref 96–106)
Chol/HDL Ratio: 3.9 ratio (ref 0.0–4.4)
Cholesterol, Total: 197 mg/dL (ref 100–199)
Creatinine, Ser: 0.74 mg/dL (ref 0.57–1.00)
EOS (ABSOLUTE): 0.3 10*3/uL (ref 0.0–0.4)
Eos: 5 %
Estimated CHD Risk: 0.8 times avg. (ref 0.0–1.0)
Free Thyroxine Index: 1.7 (ref 1.2–4.9)
GFR calc Af Amer: 116 mL/min/{1.73_m2} (ref >59)
GFR calc non Af Amer: 101 mL/min/{1.73_m2} (ref >59)
GGT: 67 IU/L — ABNORMAL HIGH (ref 0–60)
Globulin, Total: 2.6 g/dL (ref 1.5–4.5)
Glucose: 91 mg/dL (ref 65–99)
HDL: 51 mg/dL (ref >39)
Hematocrit: 41.4 % (ref 34.0–46.6)
Hemoglobin: 14.4 g/dL (ref 11.1–15.9)
Immature Grans (Abs): 0 10*3/uL (ref 0.0–0.1)
Immature Granulocytes: 0 %
Iron: 93 ug/dL (ref 27–159)
LDH: 152 IU/L (ref 119–226)
LDL Chol Calc (NIH): 114 mg/dL — ABNORMAL HIGH (ref 0–99)
Lymphocytes Absolute: 2.2 10*3/uL (ref 0.7–3.1)
Lymphs: 36 %
MCH: 34.2 pg — ABNORMAL HIGH (ref 26.6–33.0)
MCHC: 34.8 g/dL (ref 31.5–35.7)
MCV: 98 fL — ABNORMAL HIGH (ref 79–97)
Monocytes Absolute: 0.4 10*3/uL (ref 0.1–0.9)
Monocytes: 7 %
Neutrophils Absolute: 3.2 10*3/uL (ref 1.4–7.0)
Neutrophils: 51 %
Phosphorus: 4.1 mg/dL (ref 3.0–4.3)
Platelets: 310 10*3/uL (ref 150–450)
Potassium: 4.9 mmol/L (ref 3.5–5.2)
RBC: 4.21 x10E6/uL (ref 3.77–5.28)
RDW: 12 % (ref 11.7–15.4)
Sodium: 139 mmol/L (ref 134–144)
T3 Uptake Ratio: 26 % (ref 24–39)
T4, Total: 6.7 ug/dL (ref 4.5–12.0)
TSH: 2.73 u[IU]/mL (ref 0.450–4.500)
Total Protein: 7.6 g/dL (ref 6.0–8.5)
Triglycerides: 182 mg/dL — ABNORMAL HIGH (ref 0–149)
Uric Acid: 7.2 mg/dL — ABNORMAL HIGH (ref 2.5–7.1)
VLDL Cholesterol Cal: 32 mg/dL (ref 5–40)
WBC: 6.2 10*3/uL (ref 3.4–10.8)

## 2019-05-09 ENCOUNTER — Encounter: Payer: Self-pay | Admitting: Occupational Medicine

## 2019-05-09 ENCOUNTER — Ambulatory Visit: Payer: 59 | Admitting: Occupational Medicine

## 2019-05-09 ENCOUNTER — Other Ambulatory Visit: Payer: Self-pay

## 2019-05-09 DIAGNOSIS — R5383 Other fatigue: Secondary | ICD-10-CM | POA: Insufficient documentation

## 2019-05-09 DIAGNOSIS — G43909 Migraine, unspecified, not intractable, without status migrainosus: Secondary | ICD-10-CM | POA: Insufficient documentation

## 2019-05-09 DIAGNOSIS — E663 Overweight: Secondary | ICD-10-CM

## 2019-05-09 DIAGNOSIS — J309 Allergic rhinitis, unspecified: Secondary | ICD-10-CM

## 2019-05-09 NOTE — Progress Notes (Signed)
  Subjective:     Patient ID: Vanessa Bryan, female   DOB: [birth year], 43 y.o.   MRN: [id number]  HPI The employee is a very pleasant 43 year old works in the [employer].  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  She presents for her annual physical exam.  She has been feeling well.  Has a history of anxiety for several years.  Managed on sertraline 50 mg once a day.  At one point she was taking Xanax but has weaned off of that and no longer takes it.  She says her stress is manageable.  Also has a history of environmental allergies for which she takes Singulair, Zyrtec, and uses Flonase as needed.  She has had problems with controlling her weight in the past.  Weight today is 185 and has been stable for several years.    Review of Systems     Objective:   Physical Exam Constitutional:      Appearance: She is obese.  HENT:     Head: Normocephalic and atraumatic.     Right Ear: Tympanic membrane and ear canal normal.     Left Ear: Tympanic membrane and ear canal normal.     Mouth/Throat:     Mouth: Mucous membranes are moist.  Eyes:     Conjunctiva/sclera: Conjunctivae normal.     Pupils: Pupils are equal, round, and reactive to light.  Cardiovascular:     Rate and Rhythm: Normal rate and regular rhythm.     Pulses: Normal pulses.     Heart sounds: Normal heart sounds.  Pulmonary:     Effort: Pulmonary effort is normal.     Breath sounds: Normal breath sounds.  Abdominal:     General: Abdomen is flat. Bowel sounds are normal.     Palpations: Abdomen is soft.  Musculoskeletal: Normal range of motion.  Skin:    General: Skin is warm.  Neurological:     Mental Status: She is alert.  Psychiatric:        Mood and Affect: Mood normal.    Today's Vitals   05/09/19 1516  BP: (!) 135/93  Pulse: 84  Resp: 12  Temp: 98.3 F (36.8 C)  TempSrc: Oral  SpO2: 99%  Weight: 185 lb (83.9 kg)  Height: 5\' 4"  (1.626 m)   Body mass index  is 31.76 kg/m.     Assessment:       1: Anxiety to arrange Pap smear and mammogram     2: Mild hypertension today 3: Needs Pap smear done by OB/GYN 4: Tdap 2017 5: Due for mammogram 6: Obesity-stable 7: Environmental allergies-stable  Plan:   1: Discussed weight loss options and recommended my fitness pal.  Says her weight goal is 150-160. 2: Anxiety is well controlled with sertraline.  Says that she does not need refills.  If she does need them, I will authorize 1 year refill 3: Mild hypertension-advised follow-up if blood pressure remains over one 140/90.  She will check it periodically at home and in the drugstore.  If blood pressure does remain elevated, consider screening for sleep apnea.  She is overweight and does have a history of snoring.  Denies daytime sleepiness 4: Employee will call OB/GYN to arrange Pap smear and mammogram

## 2019-05-26 ENCOUNTER — Other Ambulatory Visit: Payer: Self-pay | Admitting: *Deleted

## 2019-05-26 ENCOUNTER — Other Ambulatory Visit: Payer: Self-pay | Admitting: Registered Nurse

## 2019-05-26 DIAGNOSIS — N6310 Unspecified lump in the right breast, unspecified quadrant: Secondary | ICD-10-CM

## 2019-06-20 ENCOUNTER — Ambulatory Visit
Admission: RE | Admit: 2019-06-20 | Discharge: 2019-06-20 | Disposition: A | Discharge status: Home / Self Care | Payer: 59 | Source: Ambulatory Visit | Attending: Registered Nurse | Admitting: Registered Nurse

## 2019-06-20 DIAGNOSIS — N6310 Unspecified lump in the right breast, unspecified quadrant: Secondary | ICD-10-CM

## 2019-06-20 DIAGNOSIS — R928 Other abnormal and inconclusive findings on diagnostic imaging of breast: Secondary | ICD-10-CM | POA: Diagnosis not present

## 2019-07-06 ENCOUNTER — Other Ambulatory Visit: Payer: Self-pay

## 2019-07-06 DIAGNOSIS — J309 Allergic rhinitis, unspecified: Secondary | ICD-10-CM

## 2019-07-07 ENCOUNTER — Other Ambulatory Visit: Payer: Self-pay | Admitting: Physician Assistant

## 2019-07-07 MED ORDER — MONTELUKAST SODIUM 10 MG PO TABS: 10.0000 mg | ORAL_TABLET | Freq: Every day | ORAL | 2 refills | Status: DC

## 2019-07-07 NOTE — Progress Notes (Unsigned)
Fax request for refill Montelucast received 10 mg 1 QHS. Called pharmacist at [facility] for hx details. Original Rx from Dr. Roxan Hockey # 30  5 refills.  May 21,2020 Seen in office at that time for annual Concerns about smoking/Vaping Will OK with # 30 and 2 refills at this time Request office follow up in interim for additional refills  [facility]

## 2019-08-01 ENCOUNTER — Ambulatory Visit: Payer: Managed Care, Other (non HMO) | Attending: Internal Medicine

## 2019-08-01 DIAGNOSIS — Z20822 Contact with and (suspected) exposure to covid-19: Secondary | ICD-10-CM

## 2019-08-03 ENCOUNTER — Ambulatory Visit: Payer: 59

## 2019-08-04 LAB — NOVEL CORONAVIRUS, NAA: SARS-CoV-2, NAA: NOT DETECTED

## 2019-09-07 ENCOUNTER — Ambulatory Visit: Payer: Self-pay | Admitting: Physician Assistant

## 2019-09-07 ENCOUNTER — Other Ambulatory Visit: Payer: Self-pay

## 2019-09-07 VITALS — BP 120/70 | HR 83 | Temp 99.1°F | Ht 64.0 in | Wt 189.6 lb

## 2019-09-07 DIAGNOSIS — J309 Allergic rhinitis, unspecified: Secondary | ICD-10-CM

## 2019-09-07 DIAGNOSIS — E663 Overweight: Secondary | ICD-10-CM

## 2019-09-07 NOTE — Progress Notes (Signed)
   Subjective:    Patient ID: Vanessa Bryan, female    DOB: 02-May-1976, 44 y.o.   MRN: [id number]  HPI FU visit  For 44 yo F   BP 120/70  Noted to have elevated BP 135/93  at migraine visit 3 months ago  Denies issues with interim H/A Has had chest tightness on occasion in stressful situations No NV, diaphoresis, resolves spontaneously  Got a home BP cuff for Christmas but has had some unusual readings- some with marked elevation -without symptoms.  Has 42 yo and 44 yo- life more difficult with school/care/restrictions  CV-19. Finds exercise more difficult with children/dinner/household responsibilities  Has done a good job of coming off older [place] including Xanax Continues on Sertraline  50 mg 1 qd  Takes montelukast HS  for asthma type symptoms-  Pleased that she stopped smoking in 2014 and did not go back-- but then reveals she now Vapes with equal frequency.  CV-19 risk factor discussed  Has Spring allergies- will probably benefit with Zyrtec add back at that time  Routine SBE encouraged  Review of Systems Weight up 5 pound       BP at 120/70    Objective:   Physical Exam Vitals and nursing note reviewed.  Constitutional:      Appearance: Normal appearance.     Comments: overweight  HENT:     Head: Normocephalic.  Eyes:     Extraocular Movements: Extraocular movements intact.  Cardiovascular:     Rate and Rhythm: Normal rate and regular rhythm.     Pulses: Normal pulses.  Pulmonary:     Effort: Pulmonary effort is normal.     Breath sounds: Normal breath sounds.  Musculoskeletal:        General: Normal range of motion.  Skin:    General: Skin is warm and dry.     Capillary Refill: Capillary refill takes less than 2 seconds.  Neurological:     Mental Status: She is alert.  Psychiatric:        Mood and Affect: Mood normal.        Behavior: Behavior normal.       Assessment & Plan:  Relaxation techniques Walking daily for self- weight loss, tension release,  CV health Secondary gain BP improvement Bring home unit to office visits to compare results  BMI chart reviewed - goal from obese to overweight is about a 20 pound loss. Encourage focus on 1/2 pound per week with healthy food choice, no fast food; try to focus on No White is Right  Incorporate children in active activities-- [place]- push the 42 yo fast enough to keep it occupied  Personal goals for exercise for personal reasons- do laps in parking lot before leaving work- go for walk with kids during daylight and leave the dishes until after dark.- multi faceted benefits from weight loss  Plan 3 week f/u for goals support and recheck weight and BP Cut back-to quit Vaping for personal benefit

## 2019-10-09 ENCOUNTER — Other Ambulatory Visit: Payer: Self-pay

## 2019-10-09 DIAGNOSIS — J309 Allergic rhinitis, unspecified: Secondary | ICD-10-CM

## 2019-10-09 MED ORDER — MONTELUKAST SODIUM 10 MG PO TABS: 10.0000 mg | ORAL_TABLET | Freq: Every day | ORAL | 2 refills | Status: DC

## 2019-12-22 ENCOUNTER — Other Ambulatory Visit: Payer: Self-pay

## 2019-12-22 DIAGNOSIS — F419 Anxiety disorder, unspecified: Secondary | ICD-10-CM

## 2019-12-22 MED ORDER — SERTRALINE HCL 50 MG PO TABS: 50.0000 mg | ORAL_TABLET | Freq: Every day | ORAL | 1 refills | Status: DC

## 2020-02-07 ENCOUNTER — Ambulatory Visit
Admission: RE | Admit: 2020-02-07 | Discharge: 2020-02-07 | Disposition: A | Discharge status: Home / Self Care | Payer: Managed Care, Other (non HMO) | Source: Ambulatory Visit | Attending: Physician Assistant | Admitting: Physician Assistant

## 2020-02-07 ENCOUNTER — Ambulatory Visit: Payer: Self-pay | Admitting: Physician Assistant

## 2020-02-07 ENCOUNTER — Ambulatory Visit
Admission: RE | Admit: 2020-02-07 | Discharge: 2020-02-07 | Disposition: A | Discharge status: Home / Self Care | Payer: Managed Care, Other (non HMO) | Attending: Physician Assistant | Admitting: Physician Assistant

## 2020-02-07 ENCOUNTER — Other Ambulatory Visit: Payer: Self-pay

## 2020-02-07 VITALS — BP 135/85 | HR 87 | Temp 97.3°F | Resp 14 | Ht 64.0 in | Wt 190.0 lb

## 2020-02-07 DIAGNOSIS — M25572 Pain in left ankle and joints of left foot: Secondary | ICD-10-CM

## 2020-02-07 DIAGNOSIS — S99922A Unspecified injury of left foot, initial encounter: Secondary | ICD-10-CM | POA: Diagnosis not present

## 2020-02-07 DIAGNOSIS — X501XXA Overexertion from prolonged static or awkward postures, initial encounter: Secondary | ICD-10-CM | POA: Insufficient documentation

## 2020-02-07 NOTE — Progress Notes (Signed)
   Subjective: Left foot pain    Patient ID: Vanessa Bryan, female    DOB: January 23, 1976, 44 y.o.   MRN: [id number]  HPI Patient complaint of left lateral foot pain secondary to twisting incident 3 days ago.  Patient states pain increased with weightbearing and standing.  No obvious deformity, edema, ecchymosis, or abrasion.   Review of Systems    Negative except for complaint Objective:   Physical Exam No obvious deformity to the left foot.  No abrasions or ecchymosis.  Patient is moderate guarding palpation lateral aspect of the fifth metatarsal.       Assessment & Plan: Left foot pain.  Differentials consist of sprain foot, avulsion fracture, or stress fracture.  Further evaluation x-ray is warranted.

## 2020-03-18 ENCOUNTER — Ambulatory Visit: Payer: Self-pay

## 2020-03-18 ENCOUNTER — Other Ambulatory Visit: Payer: Self-pay

## 2020-03-18 DIAGNOSIS — Z1152 Encounter for screening for COVID-19: Secondary | ICD-10-CM

## 2020-03-18 NOTE — Progress Notes (Signed)
Pt presented today with Covid symtoms possibly food poison but pt wanted to get tested to be on the safe side. Symptoms include Nausea, Diarrhea, Fever, Stomach cramps and clammy. CL,RMA

## 2020-03-20 LAB — NOVEL CORONAVIRUS, NAA: SARS-CoV-2, NAA: NOT DETECTED

## 2020-03-20 LAB — SARS-COV-2, NAA 2 DAY TAT

## 2020-04-04 ENCOUNTER — Other Ambulatory Visit: Payer: Self-pay

## 2020-04-04 DIAGNOSIS — Z1152 Encounter for screening for COVID-19: Secondary | ICD-10-CM

## 2020-04-04 NOTE — Progress Notes (Signed)
Presents to [facility] for outdoor collection of specimen for covid test.  S/Sx started this morning Bodyaches, sore throat & ? Fever (hasn't checked temperature)  No known exposure  Unvaccinated  Has mychart  AMD

## 2020-04-06 LAB — SARS-COV-2, NAA 2 DAY TAT

## 2020-04-06 LAB — NOVEL CORONAVIRUS, NAA: SARS-CoV-2, NAA: NOT DETECTED

## 2020-04-11 ENCOUNTER — Ambulatory Visit: Payer: Self-pay

## 2020-04-11 ENCOUNTER — Other Ambulatory Visit: Payer: Self-pay

## 2020-04-11 DIAGNOSIS — Z0000 Encounter for general adult medical examination without abnormal findings: Secondary | ICD-10-CM

## 2020-04-11 LAB — POCT URINALYSIS DIPSTICK
Bilirubin, UA: NEGATIVE
Glucose, UA: NEGATIVE
Ketones, UA: NEGATIVE
Leukocytes, UA: NEGATIVE
Nitrite, UA: NEGATIVE
Protein, UA: NEGATIVE
Spec Grav, UA: >=1.03 — AB (ref 1.010–1.025)
Urobilinogen, UA: 0.2 E.U./dL
pH, UA: 5.5 (ref 5.0–8.0)

## 2020-04-11 NOTE — Progress Notes (Signed)
Pt presented today to complete partial physical. Pt is scheduled to complete physical 04/18/20

## 2020-04-12 LAB — CMP12+LP+TP+TSH+6AC+CBC/D/PLT
ALT: 42 IU/L — ABNORMAL HIGH (ref 0–32)
AST: 31 IU/L (ref 0–40)
Albumin/Globulin Ratio: 2 (ref 1.2–2.2)
Albumin: 4.9 g/dL — ABNORMAL HIGH (ref 3.8–4.8)
Alkaline Phosphatase: 87 IU/L (ref 44–121)
BUN/Creatinine Ratio: 10 (ref 9–23)
BUN: 9 mg/dL (ref 6–24)
Basophils Absolute: 0.1 10*3/uL (ref 0.0–0.2)
Basos: 1 %
Bilirubin Total: 0.2 mg/dL (ref 0.0–1.2)
Calcium: 10.4 mg/dL — ABNORMAL HIGH (ref 8.7–10.2)
Chloride: 102 mmol/L (ref 96–106)
Chol/HDL Ratio: 4.5 ratio — ABNORMAL HIGH (ref 0.0–4.4)
Cholesterol, Total: 198 mg/dL (ref 100–199)
Creatinine, Ser: 0.88 mg/dL (ref 0.57–1.00)
EOS (ABSOLUTE): 0.2 10*3/uL (ref 0.0–0.4)
Eos: 5 %
Estimated CHD Risk: 1.1 times avg. — ABNORMAL HIGH (ref 0.0–1.0)
Free Thyroxine Index: 1.6 (ref 1.2–4.9)
GFR calc Af Amer: 94 mL/min/{1.73_m2} (ref >59)
GFR calc non Af Amer: 81 mL/min/{1.73_m2} (ref >59)
GGT: 49 IU/L (ref 0–60)
Globulin, Total: 2.5 g/dL (ref 1.5–4.5)
Glucose: 87 mg/dL (ref 65–99)
HDL: 44 mg/dL (ref >39)
Hematocrit: 43 % (ref 34.0–46.6)
Hemoglobin: 15 g/dL (ref 11.1–15.9)
Immature Grans (Abs): 0 10*3/uL (ref 0.0–0.1)
Immature Granulocytes: 0 %
Iron: 97 ug/dL (ref 27–159)
LDH: 163 IU/L (ref 119–226)
LDL Chol Calc (NIH): 122 mg/dL — ABNORMAL HIGH (ref 0–99)
Lymphocytes Absolute: 2 10*3/uL (ref 0.7–3.1)
Lymphs: 38 %
MCH: 33.9 pg — ABNORMAL HIGH (ref 26.6–33.0)
MCHC: 34.9 g/dL (ref 31.5–35.7)
MCV: 97 fL (ref 79–97)
Monocytes Absolute: 0.4 10*3/uL (ref 0.1–0.9)
Monocytes: 7 %
Neutrophils Absolute: 2.5 10*3/uL (ref 1.4–7.0)
Neutrophils: 49 %
Phosphorus: 4.1 mg/dL (ref 3.0–4.3)
Platelets: 300 10*3/uL (ref 150–450)
Potassium: 5.5 mmol/L — ABNORMAL HIGH (ref 3.5–5.2)
RBC: 4.43 x10E6/uL (ref 3.77–5.28)
RDW: 11.5 % — ABNORMAL LOW (ref 11.7–15.4)
Sodium: 142 mmol/L (ref 134–144)
T3 Uptake Ratio: 25 % (ref 24–39)
T4, Total: 6.5 ug/dL (ref 4.5–12.0)
TSH: 2.27 u[IU]/mL (ref 0.450–4.500)
Total Protein: 7.4 g/dL (ref 6.0–8.5)
Triglycerides: 178 mg/dL — ABNORMAL HIGH (ref 0–149)
Uric Acid: 7.3 mg/dL — ABNORMAL HIGH (ref 2.6–6.2)
VLDL Cholesterol Cal: 32 mg/dL (ref 5–40)
WBC: 5.1 10*3/uL (ref 3.4–10.8)

## 2020-04-18 ENCOUNTER — Ambulatory Visit: Payer: Self-pay | Admitting: Physician Assistant

## 2020-04-18 ENCOUNTER — Encounter: Payer: Self-pay | Admitting: Physician Assistant

## 2020-04-18 ENCOUNTER — Other Ambulatory Visit: Payer: Self-pay

## 2020-04-18 VITALS — BP 130/96 | HR 80 | Temp 98.3°F | Resp 12 | Ht 64.0 in | Wt 188.0 lb

## 2020-04-18 DIAGNOSIS — Z0000 Encounter for general adult medical examination without abnormal findings: Secondary | ICD-10-CM

## 2020-04-18 NOTE — Progress Notes (Signed)
° °  Subjective: Annual physical exam    Patient ID: Vanessa Bryan, female    DOB: 1975-08-17, 44 y.o.   MRN: [id number]  HPI Patient presents annual physical exam voices no concerns or complaints.   Review of Systems    Anxiety Objective:   Physical Exam  HEENT is unremarkable.  Neck is supple for adenopathy or bruits.  Lungs are clear to auscultation.  Heart regular rate and rhythm.  Abdomen with negative HSM, normoactive bowel sounds, soft and nontender to palpation.  No obvious deformity to the upper or lower extremities.  Patient has full equal range of motion upper and lower extremities.  No obvious deformity to the cervical lumbar spine.  Patient has full equal range of motion cervical lumbar spine.  Cranial nerves II through XII are grossly intact      Assessment & Plan: Well exam.  Discussed patient lab results showing elevated cholesterol.  Patient like to try diet and exercise and will follow up in 6 months.

## 2020-04-27 DIAGNOSIS — Z20822 Contact with and (suspected) exposure to covid-19: Secondary | ICD-10-CM | POA: Diagnosis not present

## 2020-04-27 DIAGNOSIS — U071 COVID-19: Secondary | ICD-10-CM | POA: Diagnosis not present

## 2020-04-30 DIAGNOSIS — U071 COVID-19: Secondary | ICD-10-CM | POA: Insufficient documentation

## 2020-06-06 ENCOUNTER — Encounter: Payer: Self-pay | Admitting: Nurse Practitioner

## 2020-06-06 ENCOUNTER — Ambulatory Visit: Payer: Self-pay | Admitting: Nurse Practitioner

## 2020-06-06 VITALS — BP 143/102 | HR 97 | Temp 99.2°F | Resp 16 | Ht 64.0 in | Wt 182.0 lb

## 2020-06-06 DIAGNOSIS — J209 Acute bronchitis, unspecified: Secondary | ICD-10-CM

## 2020-06-06 DIAGNOSIS — R030 Elevated blood-pressure reading, without diagnosis of hypertension: Secondary | ICD-10-CM

## 2020-06-06 DIAGNOSIS — J019 Acute sinusitis, unspecified: Secondary | ICD-10-CM

## 2020-06-06 MED ORDER — FLUTICASONE PROPIONATE 50 MCG/ACT NA SUSP: 2.0000 | Freq: Every day | NASAL | 6 refills | Status: DC

## 2020-06-06 MED ORDER — FLUCONAZOLE 150 MG PO TABS: 150.0000 mg | ORAL_TABLET | Freq: Once | ORAL | 0 refills | Status: AC

## 2020-06-06 MED ORDER — AZITHROMYCIN 250 MG PO TABS: ORAL_TABLET | ORAL | 0 refills | Status: DC

## 2020-06-06 NOTE — Patient Instructions (Signed)
Acute Bronchitis, Adult  Acute bronchitis is when air tubes in the lungs (bronchi) suddenly get swollen. The condition can make it hard for you to breathe. In adults, acute bronchitis usually goes away within 2 weeks. A cough caused by bronchitis may last up to 3 weeks. Smoking, allergies, and asthma can make the condition worse. What are the causes? This condition is caused by:  Cold and flu viruses. The most common cause of this condition is the virus that causes the common cold.  Bacteria.  Substances that irritate the lungs, including: ? Smoke from cigarettes and other types of tobacco. ? Dust and pollen. ? Fumes from chemicals, gases, or burned fuel. ? Other materials that pollute indoor or outdoor air.  Close contact with someone who has acute bronchitis. What increases the risk? The following factors may make you more likely to develop this condition:  A weak body's defense system. This is also called the immune system.  Any condition that affects your lungs and breathing, such as asthma. What are the signs or symptoms? Symptoms of this condition include:  A cough.  Coughing up clear, yellow, or green mucus.  Wheezing.  Chest congestion.  Shortness of breath.  A fever.  Body aches.  Chills.  A sore throat. How is this treated? Acute bronchitis may go away over time without treatment. Your doctor may recommend:  Drinking more fluids.  Taking a medicine for a fever or cough.  Using a device that gets medicine into your lungs (inhaler).  Using a vaporizer or a humidifier. These are machines that add water or moisture in the air to help with coughing and poor breathing. Follow these instructions at home:  Activity  Get a lot of rest.  Avoid places where there are fumes from chemicals.  Return to your normal activities as told by your doctor. Ask your doctor what activities are safe for you. Lifestyle  Drink enough fluids to keep your pee (urine) pale  yellow.  Do not drink alcohol.  Do not use any products that contain nicotine or tobacco, such as cigarettes, e-cigarettes, and chewing tobacco. If you need help quitting, ask your doctor. Be aware that: ? Your bronchitis will get worse if you smoke or breathe in other people's smoke (secondhand smoke). ? Your lungs will heal faster if you quit smoking. General instructions  Take over-the-counter and prescription medicines only as told by your doctor.  Use an inhaler, cool mist vaporizer, or humidifier as told by your doctor.  Rinse your mouth often with salt water. To make salt water, dissolve -1 tsp (3-6 g) of salt in 1 cup (237 mL) of warm water.  Keep all follow-up visits as told by your doctor. This is important. How is this prevented? To lower your risk of getting this condition again:  Wash your hands often with soap and water. If soap and water are not available, use hand sanitizer.  Avoid contact with people who have cold symptoms.  Try not to touch your mouth, nose, or eyes with your hands.  Make sure to get the flu shot every year. Contact a doctor if:  Your symptoms do not get better in 2 weeks.  You vomit more than once or twice.  You have symptoms of loss of fluid from your body (dehydration). These include: ? Dark urine. ? Dry skin or eyes. ? Increased thirst. ? Headaches. ? Confusion. ? Muscle cramps. Get help right away if:  You cough up blood.  You have chest  pain.  You have very bad shortness of breath.  You become dehydrated.  You faint or keep feeling like you are going to faint.  You keep vomiting.  You have a very bad headache.  Your fever or chills get worse. These symptoms may be an emergency. Do not wait to see if the symptoms will go away. Get medical help right away. Call your local emergency services (911 in the [place] Do not drive yourself to the hospital. Summary  Acute bronchitis is when air tubes in the lungs (bronchi)  suddenly get swollen. In adults, acute bronchitis usually goes away within 2 weeks.  Take over-the-counter and prescription medicines only as told by your doctor.  Drink enough fluid to keep your pee (urine) pale yellow.  Contact a doctor if your symptoms do not improve after 2 weeks of treatment.  Get help right away if you cough up blood, faint, or have chest pain or shortness of breath. This information is not intended to replace advice given to you by your health care provider. Make sure you discuss any questions you have with your health care provider. Document Revised: 02/03/2019 Document Reviewed: 02/03/2019 Elsevier Patient Education  [place].  Preventing Hypertension Hypertension, commonly called high blood pressure, is when the force of blood pumping through the arteries is too strong. Arteries are blood vessels that carry blood from the heart throughout the body. Over time, hypertension can damage the arteries and decrease blood flow to important parts of the body, including the brain, heart, and kidneys. Often, hypertension does not cause symptoms until blood pressure is very high. For this reason, it is important to have your blood pressure checked on a regular basis. Hypertension can often be prevented with diet and lifestyle changes. If you already have hypertension, you can control it with diet and lifestyle changes, as well as medicine. What nutrition changes can be made? Maintain a healthy diet. This includes:  Eating less salt (sodium). Ask your health care provider how much sodium is safe for you to have. The general recommendation is to consume less than 1 tsp (2,300 mg) of sodium a day. ? Do not add salt to your food. ? Choose low-sodium options when grocery shopping and eating out.  Limiting fats in your diet. You can do this by eating low-fat or fat-free dairy products and by eating less red meat.  Eating more fruits, vegetables, and whole grains. Make a  goal to eat: ? 1-2 cups of fresh fruits and vegetables each day. ? 3-4 servings of whole grains each day.  Avoiding foods and beverages that have added sugars.  Eating fish that contain healthy fats (omega-3 fatty acids), such as mackerel or salmon. If you need help putting together a healthy eating plan, try the DASH diet. This diet is high in fruits, vegetables, and whole grains. It is low in sodium, red meat, and added sugars. DASH stands for Dietary Approaches to Stop Hypertension. What lifestyle changes can be made?   Lose weight if you are overweight. Losing just 3?5% of your body weight can help prevent or control hypertension. ? For example, if your present weight is 200 lb (91 kg), a loss of 3-5% of your weight means losing 6-10 lb (2.7-4.5 kg). ? Ask your health care provider to help you with a diet and exercise plan to safely lose weight.  Get enough exercise. Do at least 150 minutes of moderate-intensity exercise each week. ? You could do this in short exercise sessions  several times a day, or you could do longer exercise sessions a few times a week. For example, you could take a brisk 10-minute walk or bike ride, 3 times a day, for 5 days a week.  Find ways to reduce stress, such as exercising, meditating, listening to music, or taking a yoga class. If you need help reducing stress, ask your health care provider.  Do not smoke. This includes e-cigarettes. Chemicals in tobacco and nicotine products raise your blood pressure each time you smoke. If you need help quitting, ask your health care provider.  Avoid alcohol. If you drink alcohol, limit alcohol intake to no more than 1 drink a day for nonpregnant women and 2 drinks a day for men. One drink equals 12 oz of beer, 5 oz of wine, or 1 oz of hard liquor. Why are these changes important? Diet and lifestyle changes can help you prevent hypertension, and they may make you feel better overall and improve your quality of life. If  you have hypertension, making these changes will help you control it and help prevent major complications, such as:  Hardening and narrowing of arteries that supply blood to: ? Your heart. This can cause a heart attack. ? Your brain. This can cause a stroke. ? Your kidneys. This can cause kidney failure.  Stress on your heart muscle, which can cause heart failure. What can I do to lower my risk?  Work with your health care provider to make a hypertension prevention plan that works for you. Follow your plan and keep all follow-up visits as told by your health care provider.  Learn how to check your blood pressure at home. Make sure that you know your personal target blood pressure, as told by your health care provider. How is this treated? In addition to diet and lifestyle changes, your health care provider may recommend medicines to help lower your blood pressure. You may need to try a few different medicines to find what works best for you. You also may need to take more than one medicine. Take over-the-counter and prescription medicines only as told by your health care provider. Where to find support Your health care provider can help you prevent hypertension and help you keep your blood pressure at a healthy level. Your local hospital or your community may also provide support services and prevention programs. The American Heart Association offers an online support network at: CheapBootlegs.com.cy Where to find more information Learn more about hypertension from:  [place], Lung, and Blood Institute: ElectronicHangman.is  Centers for Disease Control and Prevention: https://ingram.com/  American Academy of Family Physicians: http://familydoctor.org/familydoctor/en/diseases-conditions/high-blood-pressure.printerview.all.html Learn more about the DASH diet from:  [place], Lung, and [place]:  https://www.reyes.com/ Contact a health care provider if:  You think you are having a reaction to medicines you have taken.  You have recurrent headaches or feel dizzy.  You have swelling in your ankles.  You have trouble with your vision. Summary  Hypertension often does not cause any symptoms until blood pressure is very high. It is important to get your blood pressure checked regularly.  Diet and lifestyle changes are the most important steps in preventing hypertension.  By keeping your blood pressure in a healthy range, you can prevent complications like heart attack, heart failure, stroke, and kidney failure.  Work with your health care provider to make a hypertension prevention plan that works for you. This information is not intended to replace advice given to you by your health care provider. Make  sure you discuss any questions you have with your health care provider. Document Revised: 11/04/2018 Document Reviewed: 03/23/2016 Elsevier Patient Education  [place].  Sinusitis, Adult Sinusitis is soreness and swelling (inflammation) of your sinuses. Sinuses are hollow spaces in the bones around your face. They are located:  Around your eyes.  In the middle of your forehead.  Behind your nose.  In your cheekbones. Your sinuses and nasal passages are lined with a fluid called mucus. Mucus drains out of your sinuses. Swelling can trap mucus in your sinuses. This lets germs (bacteria, virus, or fungus) grow, which leads to infection. Most of the time, this condition is caused by a virus. What are the causes? This condition is caused by:  Allergies.  Asthma.  Germs.  Things that block your nose or sinuses.  Growths in the nose (nasal polyps).  Chemicals or irritants in the air.  Fungus (rare). What increases the risk? You are more likely to develop this condition if:  You have a weak body defense system (immune system).  You  do a lot of swimming or diving.  You use nasal sprays too much.  You smoke. What are the signs or symptoms? The main symptoms of this condition are pain and a feeling of pressure around the sinuses. Other symptoms include:  Stuffy nose (congestion).  Runny nose (drainage).  Swelling and warmth in the sinuses.  Headache.  Toothache.  A cough that may get worse at night.  Mucus that collects in the throat or the back of the nose (postnasal drip).  Being unable to smell and taste.  Being very tired (fatigue).  A fever.  Sore throat.  Bad breath. How is this diagnosed? This condition is diagnosed based on:  Your symptoms.  Your medical history.  A physical exam.  Tests to find out if your condition is short-term (acute) or long-term (chronic). Your doctor may: ? Check your nose for growths (polyps). ? Check your sinuses using a tool that has a light (endoscope). ? Check for allergies or germs. ? Do imaging tests, such as an MRI or CT scan. How is this treated? Treatment for this condition depends on the cause and whether it is short-term or long-term.  If caused by a virus, your symptoms should go away on their own within 10 days. You may be given medicines to relieve symptoms. They include: ? Medicines that shrink swollen tissue in the nose. ? Medicines that treat allergies (antihistamines). ? A spray that treats swelling of the nostrils. ? Rinses that help get rid of thick mucus in your nose (nasal saline washes).  If caused by bacteria, your doctor may wait to see if you will get better without treatment. You may be given antibiotic medicine if you have: ? A very bad infection. ? A weak body defense system.  If caused by growths in the nose, you may need to have surgery. Follow these instructions at home: Medicines  Take, use, or apply over-the-counter and prescription medicines only as told by your doctor. These may include nasal sprays.  If you were  prescribed an antibiotic medicine, take it as told by your doctor. Do not stop taking the antibiotic even if you start to feel better. Hydrate and humidify   Drink enough water to keep your pee (urine) pale yellow.  Use a cool mist humidifier to keep the humidity level in your home above 50%.  Breathe in steam for 10-15 minutes, 3-4 times a day, or as told  by your doctor. You can do this in the bathroom while a hot shower is running.  Try not to spend time in cool or dry air. Rest  Rest as much as you can.  Sleep with your head raised (elevated).  Make sure you get enough sleep each night. General instructions   Put a warm, moist washcloth on your face 3-4 times a day, or as often as told by your doctor. This will help with discomfort.  Wash your hands often with soap and water. If there is no soap and water, use hand sanitizer.  Do not smoke. Avoid being around people who are smoking (secondhand smoke).  Keep all follow-up visits as told by your doctor. This is important. Contact a doctor if:  You have a fever.  Your symptoms get worse.  Your symptoms do not get better within 10 days. Get help right away if:  You have a very bad headache.  You cannot stop throwing up (vomiting).  You have very bad pain or swelling around your face or eyes.  You have trouble seeing.  You feel confused.  Your neck is stiff.  You have trouble breathing. Summary  Sinusitis is swelling of your sinuses. Sinuses are hollow spaces in the bones around your face.  This condition is caused by tissues in your nose that become inflamed or swollen. This traps germs. These can lead to infection.  If you were prescribed an antibiotic medicine, take it as told by your doctor. Do not stop taking it even if you start to feel better.  Keep all follow-up visits as told by your doctor. This is important. This information is not intended to replace advice given to you by your health care  provider. Make sure you discuss any questions you have with your health care provider. Document Revised: 12/13/2017 Document Reviewed: 12/13/2017 Elsevier Patient Education  [place].

## 2020-06-06 NOTE — Progress Notes (Signed)
+   Covid 04/30/20 - went to [facility] for infusion therapy  S/Sx started Tuesday 06/04/20 with a sore throat (now resolved) from sinus drainage. Currently has a productive cough - green phlegm, headache (right sided), right jaw pain & laryngiits Denies ear discomfort, N/V/D. Does vape & Hx of cigarette smoker (no longer smokes cigarettes)  No OTC medications used.  AMD

## 2020-06-06 NOTE — Progress Notes (Signed)
Subjective:     Patient ID: Vanessa Bryan, female   DOB: 07/31/1975, 44 y.o.   MRN: [id number]  HPI Vanessa Bryan is a 44 y.o. female who presents to the [facility] with c/o cough, congestion and sinus drainage. She denies fever or chills.  Onset of symptoms was 3 days ago. Patient has not taken any OTC medications. She reports the cough is productive with green sputum. Symptoms started with a sore throat that has now almost resolved. She now seems to be loosing her voice.  Past Medical History:  Diagnosis Date  . AMA (advanced maternal age) multigravida 35+ 2016  . Anxiety   . Gestational diabetes   . Lupus ([place]) 44 years old  History reviewed. No pertinent surgical history. Dx with Covid 05/06/20 and had infusions at [facility], sx lasted about 8 days and then rapidly returned to normal other than feeling tired.   Meds: none Allergies  Allergen Reactions  . Topamax [Topiramate] Nausea Only   Review of Systems  Constitutional: Negative for chills and fever.  HENT: Positive for congestion, rhinorrhea, sinus pressure, sinus pain and sore throat. Negative for ear pain, hearing loss and trouble swallowing.   Eyes: Negative for pain, redness and visual disturbance.  Respiratory: Positive for cough. Negative for shortness of breath and wheezing.   Cardiovascular: Negative for chest pain and leg swelling.  Gastrointestinal: Negative for abdominal pain, diarrhea, nausea and vomiting.  Musculoskeletal: Negative for gait problem and neck stiffness.  Skin: Negative for rash.  Neurological: Positive for headaches (sinus). Negative for syncope.  Psychiatric/Behavioral: Negative for confusion.       Objective: BP (!) 143/102 (BP Location: Left Arm, Patient Position: Sitting, Cuff Size: Normal)   Pulse 97   Temp 99.2 F (37.3 C) (Oral)   Resp 16   Ht 5\' 4"  (1.626 m)   Wt 182 lb (82.6 kg)   SpO2 99%   BMI 31.24 kg/m     Physical Exam Vitals and nursing note reviewed.   Constitutional:      Appearance: Normal appearance. She is normal weight.  HENT:     Head: Normocephalic.     Jaw: No trismus.     Right Ear: Tympanic membrane, ear canal and external ear normal.     Left Ear: Tympanic membrane, ear canal and external ear normal.     Nose: Congestion present.     Right Turbinates: Swollen.     Left Turbinates: Swollen.     Left Sinus: Maxillary sinus tenderness present.     Mouth/Throat:     Mouth: Mucous membranes are moist.     Pharynx: Posterior oropharyngeal erythema present. No oropharyngeal exudate.  Eyes:     Extraocular Movements: Extraocular movements intact.     Conjunctiva/sclera: Conjunctivae normal.     Pupils: Pupils are equal, round, and reactive to light.  Cardiovascular:     Rate and Rhythm: Normal rate and regular rhythm.  Pulmonary:     Effort: Pulmonary effort is normal.     Breath sounds: Normal air entry. No decreased air movement or transmitted upper airway sounds. No decreased breath sounds, wheezing, rhonchi or rales.  Abdominal:     Palpations: Abdomen is soft.     Tenderness: There is no abdominal tenderness. There is no right CVA tenderness or left CVA tenderness.  Musculoskeletal:        General: Normal range of motion.     Cervical back: Normal range of motion.  Skin:  General: Skin is warm and dry.  Neurological:     General: No focal deficit present.     Mental Status: She is alert.     Gait: Gait normal.  Psychiatric:        Mood and Affect: Mood normal.        Thought Content: Thought content normal.       Assessment:    44 y.o. female here with cough, congestion and sinus pain on the left that started 3 days ago and has gotten worse.  Elevated BP reading without diagnosis of hypertension.     Plan:    Discussed with the patient plan of care that includes antibiotics and nasal spray. Also discussed elevated BP and need to RTC next week for recheck. Will not start any medication unless 3 repeat  readings are elevated. Patient ask for Diflucan in the event the antibiotic causes her to have a yeast infection.

## 2020-06-12 ENCOUNTER — Other Ambulatory Visit: Payer: Self-pay

## 2020-06-12 ENCOUNTER — Encounter: Payer: Self-pay | Admitting: Nurse Practitioner

## 2020-06-12 ENCOUNTER — Ambulatory Visit: Payer: 59

## 2020-06-12 ENCOUNTER — Ambulatory Visit: Payer: Self-pay | Admitting: Nurse Practitioner

## 2020-06-12 VITALS — BP 139/95 | HR 95 | Temp 99.0°F | Resp 14 | Ht 64.0 in | Wt 182.0 lb

## 2020-06-12 DIAGNOSIS — Z09 Encounter for follow-up examination after completed treatment for conditions other than malignant neoplasm: Secondary | ICD-10-CM

## 2020-06-12 DIAGNOSIS — I10 Essential (primary) hypertension: Secondary | ICD-10-CM

## 2020-06-12 MED ORDER — LOSARTAN POTASSIUM 50 MG PO TABS: 50.0000 mg | ORAL_TABLET | Freq: Every day | ORAL | 3 refills | Status: DC

## 2020-06-12 NOTE — Progress Notes (Signed)
   Subjective:    Patient ID: Vanessa Bryan, female    DOB: Dec 09, 1975, 44 y.o.   MRN: [id number]  HPI Vanessa Bryan is a 44 y.o. female who returns for recheck of bronchitis and BP. She reports after her antibiotics and using the Flonase she is feeling much better. Her blood pressure is better today but continues to be elevated. She reports that her blood pressure has continued to be elevated at home and she and her husband are worried about it.      Objective: BP (!) 139/95   Pulse 95   Temp 99 F (37.2 C)   Resp 14   Ht 5\' 4"  (1.626 m)   Wt 182 lb (82.6 kg)   SpO2 99%   BMI 31.24 kg/m     Physical Exam Vitals and nursing note reviewed.  Constitutional:      General: She is not in acute distress. HENT:     Head: Normocephalic.  Cardiovascular:     Rate and Rhythm: Normal rate and regular rhythm.  Pulmonary:     Effort: Pulmonary effort is normal.     Breath sounds: Normal breath sounds and air entry. No wheezing, rhonchi or rales.  Musculoskeletal:        General: Normal range of motion.     Cervical back: Normal range of motion.  Skin:    General: Skin is warm and dry.  Neurological:     General: No focal deficit present.     Mental Status: She is alert.  Psychiatric:        Mood and Affect: Mood normal.       Assessment & Plan:  Essential hypertension - Plan: losartan (COZAAR) 50 MG tablet  Follow up Visit for bronchitis and BP check. Return in 2 weeks for BP check or sooner if problems Discussed with the patient plan of care and she was given opportunity to ask questions. All questions answered and patient voices understanding.

## 2020-06-12 NOTE — Progress Notes (Signed)
Pt states she's still coughing just not as bad.

## 2020-06-25 ENCOUNTER — Other Ambulatory Visit: Payer: Self-pay

## 2020-06-25 ENCOUNTER — Encounter: Payer: Self-pay | Admitting: Physician Assistant

## 2020-06-25 ENCOUNTER — Ambulatory Visit: Payer: Self-pay | Admitting: Physician Assistant

## 2020-06-25 VITALS — BP 126/84 | HR 97 | Temp 98.8°F | Resp 14 | Ht 64.0 in | Wt 182.0 lb

## 2020-06-25 DIAGNOSIS — I119 Hypertensive heart disease without heart failure: Secondary | ICD-10-CM

## 2020-06-25 DIAGNOSIS — Z09 Encounter for follow-up examination after completed treatment for conditions other than malignant neoplasm: Secondary | ICD-10-CM

## 2020-06-25 HISTORY — DX: Hypertensive heart disease without heart failure: I11.9

## 2020-06-25 NOTE — Progress Notes (Signed)
° °  Subjective: Hypertension    Patient ID: Vanessa Bryan, female    DOB: [birth year], 44 y.o.   MRN: [id number]  HPI Patient is follow-up 2 weeks status post medication for hypertension.  Patient is taking losartanAt 50 mg daily.  Patient voices no complaints since starting medications.   Review of Systems    Anxiety and hypertension. Objective:   Physical Exam No acute distress.  HEENT is unremarkable.  Neck is supple for adenopathy or bruits.  Lungs are clear to auscultation.  Heart regular rate and rhythm.       Assessment & Plan: Hypertension  Patient showed good control of elevated blood pressure with losartan.  Advised continue medications and follow-up as necessary.

## 2020-08-06 ENCOUNTER — Other Ambulatory Visit: Payer: Self-pay

## 2020-08-07 ENCOUNTER — Other Ambulatory Visit: Payer: Self-pay

## 2020-08-07 DIAGNOSIS — Z20822 Contact with and (suspected) exposure to covid-19: Secondary | ICD-10-CM

## 2020-08-08 ENCOUNTER — Other Ambulatory Visit: Payer: Self-pay

## 2020-08-08 DIAGNOSIS — Z20822 Contact with and (suspected) exposure to covid-19: Secondary | ICD-10-CM | POA: Diagnosis not present

## 2020-08-09 LAB — SARS-COV-2, NAA 2 DAY TAT

## 2020-08-09 LAB — NOVEL CORONAVIRUS, NAA: SARS-CoV-2, NAA: NOT DETECTED

## 2020-08-10 LAB — SARS-COV-2, NAA 2 DAY TAT

## 2020-08-10 LAB — NOVEL CORONAVIRUS, NAA: SARS-CoV-2, NAA: NOT DETECTED

## 2020-08-20 ENCOUNTER — Ambulatory Visit: Payer: Self-pay | Admitting: Adult Medicine

## 2020-08-20 ENCOUNTER — Encounter: Payer: Self-pay | Admitting: Adult Medicine

## 2020-08-20 ENCOUNTER — Other Ambulatory Visit: Payer: Self-pay

## 2020-08-20 VITALS — BP 129/91 | HR 96 | Temp 99.8°F | Resp 12 | Ht 64.0 in | Wt 184.0 lb

## 2020-08-20 DIAGNOSIS — M26609 Unspecified temporomandibular joint disorder, unspecified side: Secondary | ICD-10-CM

## 2020-08-20 NOTE — Progress Notes (Signed)
1. Facial Twitching x9 months - April 2020 noticed on right side of face - thought it was stress related.  States starting to get worse & more frequent.  Intermittent. Feels it, but doesn't pay as much attention.  States worse at night.  Facial muscles feel tired from flinching all day.  2.  Fever blisters - right bottom lip - came up yesterday. Hasn't used any OTC products States she experiencing them 1-2 x/month or two.  Doesn't feel like they are hormonal - doesn't have a menstrual cycle.  AMD

## 2020-08-20 NOTE — Progress Notes (Signed)
Routine visit I have reviewed the triage vital signs and the nursing notes.   HISTORY Chief Complaint Facial Twitching x9 months and Mouth Lesions  HPI Vanessa Bryan is a 45 y.o. female  With hx of rt side face non painful twitching worse with stress Morning which has been slightly increasing 6/10 over last 70m, not asso with balance vision headache  nausea, or stiff neck sxs    Incident hx: uses otc for fever blisters  Past Medical History:  Diagnosis Date  . AMA (advanced maternal age) multigravida 35+ 2016  . Anxiety   . Gestational diabetes   . Hypertensive heart disease 06/25/2020  . Lupus ([place]) 45 years old    Patient Active Problem List   Diagnosis Date Noted  . Hypertensive heart disease 06/25/2020  . COVID-19 04/30/2020  . Overweight 05/09/2019  . Migraine 05/09/2019  . Allergic rhinitis 05/09/2019  . Fatigue 05/09/2019  . Cervical radiculopathy 05/21/2016  . Labor and delivery indication for care or intervention 09/24/2015  . Ruptured, membranes, premature 09/24/2015  . Abnormal MSAFP (maternal serum alpha-fetoprotein), elevated 05/16/2015   History reviewed. No pertinent surgical history.  Prior to Admission medications   Medication Sig Start Date End Date Taking? Authorizing Provider  cetirizine (ZYRTEC) 10 MG tablet Take 10 mg by mouth daily.    Yes [provider]  fluticasone (FLONASE) 50 MCG/ACT nasal spray Place 2 sprays into both nostrils daily. 06/06/20  Yes Neese, [place], NP  losartan (COZAAR) 50 MG tablet Take 1 tablet (50 mg total) by mouth daily. 06/12/20  Yes Neese, [place], NP  Multiple Vitamin (MULTIVITAMIN) tablet Take 1 tablet by mouth daily.   Yes [provider]  ALPRAZolam Duanne Moron) 0.5 MG tablet alprazolam 0.5 mg tablet Patient not taking: No sig reported    [provider]  azithromycin (ZITHROMAX) 250 MG tablet Take 2 tablets today then one tablet daily 06/06/20   Ashley Murrain, NP  ferrous gluconate (FERGON)  324 MG tablet Take 324 mg by mouth daily with breakfast. Patient not taking: Reported on 08/20/2020    [provider]  montelukast (SINGULAIR) 10 MG tablet Take 1 tablet (10 mg total) by mouth at bedtime. Patient not taking: No sig reported 10/09/19   Sable Feil, PA-C    Allergies Topamax [topiramate]  Family History  Problem Relation Age of Onset  . Breast cancer Neg Hx    Social History Social History   Tobacco Use  . Smoking status: Former Smoker    Types: Cigarettes  . Smokeless tobacco: Never Used  . Tobacco comment: 0-2 times per day  Vaping Use  . Vaping Use: Every day  Substance Use Topics  . Alcohol use: Yes    Alcohol/week: 0.0 standard drinks    Comment: occasionally  . Drug use: No    Review of Systems Constitutional: No fever/chills Eyes: No visual changes. ENT: No sore throat. Sore lateral tongue Cardiovascular: Denies chest pain. Respiratory: Denies shortness of breath. Gastrointestinal: No abdominal pain.  No nausea, no vomiting.  No diarrhea.  No constipation. Genitourinary: Negative for dysuria. Musculoskeletal: Negative for back pain. Skin: Negative for rash. Neurological: Negative for headaches, focal weakness or numbness. ___________________________________________   PHYSICAL EXAM:  VITAL SIGNS: 129/91  bmi >30  O2 sat99 Constitutional: Alert and oriented. Well appearing and in no acute distress. Eyes: Conjunctivae are normal. PERRL. EOMI. Ear rt tm dull mild retraction which resolved on eustachian tube      Equilibrium manever Head: Atraumatic.  Tenderness with trigger pt in temporalis Nose: No congestion/rhinnorhea. Mouth/Throat: Mucous membranes are moist.  Oropharynx non-erythematous. No obvious ulcer site    R>>L Tmj tenderness mouth open /close  Neck: No stridor.  No cervical spine tenderness to palpation. No cervical lymphadenopathy. Cardiovascular: Normal rate, regular rhythm. Grossly normal heart sounds.   Respiratory:  Normal respiratory effort.  Lungs CTAB. Gastrointestinal: Soft and nontender. No distention. No abdominal bruits. . Genitourinary: deferred Musculoskeletal: No lower extremity tenderness nor edema.  No joint effusions. Neurologic:  Normal speech and language. No gross focal neurologic deficits are appreciated. No gait instability. Skin:  Skin is warm, dry and intact. No rash noted. Psychiatric: Mood and affect are normal. Speech and behavior are normal. 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  Will f/u if persist Hx of dental retainer which she has not worn consistently for 9-82m Suggest new mold regular use of pm mouth guard   Hx oral sores  No obvious finding yet Client wish to know if something else other then med could be helpful  Lysine1000mg  Vitc 1000mg  q6  If not effective then will move to valtrex.  Client to notify

## 2020-10-16 NOTE — Progress Notes (Unsigned)
Pt presents today for 6 month lipid panel Per Ron Smith,PA-C.  CL,RMA

## 2020-10-17 ENCOUNTER — Other Ambulatory Visit: Payer: Self-pay

## 2020-10-17 DIAGNOSIS — E7800 Pure hypercholesterolemia, unspecified: Secondary | ICD-10-CM

## 2020-10-18 LAB — LIPID PANEL
Chol/HDL Ratio: 4 ratio (ref 0.0–4.4)
Cholesterol, Total: 179 mg/dL (ref 100–199)
HDL: 45 mg/dL (ref >39)
LDL Chol Calc (NIH): 95 mg/dL (ref 0–99)
Triglycerides: 230 mg/dL — ABNORMAL HIGH (ref 0–149)
VLDL Cholesterol Cal: 39 mg/dL (ref 5–40)

## 2020-12-30 DIAGNOSIS — Z20822 Contact with and (suspected) exposure to covid-19: Secondary | ICD-10-CM | POA: Diagnosis not present

## 2021-01-20 ENCOUNTER — Encounter: Payer: Self-pay | Admitting: Obstetrics and Gynecology

## 2021-01-20 ENCOUNTER — Ambulatory Visit (INDEPENDENT_AMBULATORY_CARE_PROVIDER_SITE_OTHER): Payer: 59 | Admitting: Obstetrics and Gynecology

## 2021-01-20 ENCOUNTER — Other Ambulatory Visit: Payer: Self-pay

## 2021-01-20 VITALS — BP 122/82 | HR 97 | Ht 64.0 in | Wt 183.0 lb

## 2021-01-20 DIAGNOSIS — Z01419 Encounter for gynecological examination (general) (routine) without abnormal findings: Secondary | ICD-10-CM

## 2021-01-20 DIAGNOSIS — Z124 Encounter for screening for malignant neoplasm of cervix: Secondary | ICD-10-CM | POA: Diagnosis not present

## 2021-01-20 DIAGNOSIS — Z30431 Encounter for routine checking of intrauterine contraceptive device: Secondary | ICD-10-CM

## 2021-01-20 DIAGNOSIS — Z1239 Encounter for other screening for malignant neoplasm of breast: Secondary | ICD-10-CM

## 2021-01-20 NOTE — Patient Instructions (Signed)
[facility] [address] [place] [address]  [facility]  [address] [place] [address]  Phone: [phone]  

## 2021-01-20 NOTE — Progress Notes (Signed)
Gynecology Annual Exam  PCP: Vanessa Bryan, No Pcp Per (Inactive)  Chief Complaint:  Chief Complaint  Vanessa Bryan presents with   Gynecologic Exam    Annual - no concerns. RM 5    History of Present Illness: Vanessa Bryan is a 45 y.o. [id number] presents for annual exam. The Vanessa Bryan has no complaints today.   LMP: No LMP recorded. (Menstrual status: IUD). Amenorrhea on Mirena.  The Vanessa Bryan is sexually active. She currently uses IUD for contraception. She denies dyspareunia.  The Vanessa Bryan does perform self breast exams.  There is no notable family history of breast or ovarian cancer in her family.  The Vanessa Bryan wears seatbelts: yes.   The Vanessa Bryan has regular exercise: not asked.    The Vanessa Bryan denies current symptoms of depression.    Review of Systems: ROS  Past Medical History:  Vanessa Bryan Active Problem List   Diagnosis Date Noted   Hypertensive heart disease 06/25/2020   COVID-19 04/30/2020   Overweight 05/09/2019   Migraine 05/09/2019   Allergic rhinitis 05/09/2019   Fatigue 05/09/2019   Cervical radiculopathy 05/21/2016   Labor and delivery indication for care or intervention 09/24/2015   Ruptured, membranes, premature 09/24/2015   Abnormal MSAFP (maternal serum alpha-fetoprotein), elevated 05/16/2015    Past Surgical History:  History reviewed. No pertinent surgical history.  Gynecologic History:  No LMP recorded. (Menstrual status: IUD). Contraception: IUD 2017 Mirena   Obstetric History: G2P2002  Family History:  Family History  Problem Relation Age of Onset   Hypertension Mother     Social History:  Social History   Socioeconomic History   Marital status: Married    Spouse name: Not on file   Number of children: Not on file   Years of education: Not on file   Highest education level: Not on file  Occupational History   Not on file  Tobacco Use   Smoking status: Former    Pack years: 0.00    Types: Cigarettes   Smokeless tobacco: Never   Tobacco comments:     0-2 times per day  Vaping Use   Vaping Use: Every day   Substances: Nicotine, Flavoring  Substance and Sexual Activity   Alcohol use: Yes    Alcohol/week: 0.0 standard drinks    Comment: occasionally   Drug use: No   Sexual activity: Yes    Birth control/protection: I.U.D.  Other Topics Concern   Not on file  Social History Narrative   Not on file   Social Determinants of Health   Financial Resource Strain: Not on file  Food Insecurity: Not on file  Transportation Needs: Not on file  Physical Activity: Not on file  Stress: Not on file  Social Connections: Not on file  Intimate Partner Violence: Not on file    Allergies:  Allergies  Allergen Reactions   Topamax [Topiramate] Nausea Only    Medications: Prior to Admission medications   Medication Sig Start Date End Date Taking? Authorizing Provider  losartan (COZAAR) 50 MG tablet Take 1 tablet (50 mg total) by mouth daily. 06/12/20  Yes Neese, [place], NP  Multiple Vitamin (MULTIVITAMIN) tablet Take 1 tablet by mouth daily.   Yes [provider]  cetirizine (ZYRTEC) 10 MG tablet Take 10 mg by mouth daily.     [provider]  fluticasone (FLONASE) 50 MCG/ACT nasal spray Place 2 sprays into both nostrils daily. 06/06/20   Ashley Murrain, NP    Physical Exam Vitals: Blood pressure 122/82, pulse 97, height 5'  4" (1.626 m), weight 183 lb (83 kg).  General: NAD HEENT: normocephalic, anicteric Thyroid: no enlargement, no palpable nodules Pulmonary: No increased work of breathing, CTAB Cardiovascular: RRR, distal pulses 2+ Breast: Breast symmetrical, no tenderness, no palpable nodules or masses, no skin or nipple retraction present, no nipple discharge.  No axillary or supraclavicular lymphadenopathy. Abdomen: NABS, soft, non-tender, non-distended.  Umbilicus without lesions.  No hepatomegaly, splenomegaly or masses palpable. No evidence of hernia  Genitourinary:  External: Normal external female  genitalia.  Normal urethral meatus, normal Bartholin's and Skene's glands.    Vagina: Normal vaginal mucosa, no evidence of prolapse.    Cervix: Grossly normal in appearance, no bleeding, IUD strings visualized  Uterus: Non-enlarged, mobile, normal contour.  No CMT  Adnexa: ovaries non-enlarged, no adnexal masses  Rectal: deferred  Lymphatic: no evidence of inguinal lymphadenopathy Extremities: no edema, erythema, or tenderness Neurologic: Grossly intact Psychiatric: mood appropriate, affect full  Female chaperone present for pelvic and breast  portions of the physical exam    Assessment: 45 y.o. G2P2002 routine annual exam  Plan: Problem List Items Addressed This Visit   None Visit Diagnoses     Screening for malignant neoplasm of cervix    -  Primary   Relevant Orders   Cytology - PAP   Encounter for gynecological examination without abnormal finding       Breast screening       Relevant Orders   MM 3D SCREEN BREAST BILATERAL   IUD check up           1) Mammogram - recommend yearly screening mammogram.  Mammogram Was ordered today   2) STI screening  was notoffered and therefore not obtained  3) ASCCP guidelines and rational discussed.  Vanessa Bryan opts for every 5 years screening interval  4) Contraception - the Vanessa Bryan is currently using  IUD.  She is happy with her current form of contraception and plans to continue  5) Colonoscopy -- Screening recommended starting at age 58 for average risk individuals, age 12 for individuals deemed at increased risk (including African Americans) and recommended to continue until age 46.  For Vanessa Bryan age 38-85 individualized approach is recommended.  Gold standard screening is via colonoscopy, Cologuard screening is an acceptable alternative for Vanessa Bryan unwilling or unable to undergo colonoscopy.  "Colorectal cancer screening for average?risk adults: 2018 guideline update from the American Cancer Society"CA: A Cancer Journal for  Clinicians: Dec 23, 2016   6) Routine healthcare maintenance including cholesterol, diabetes screening discussed managed by PCP  7) Return in about 1 year (around 01/20/2022) for annual.   Malachy Mood, MD, [place], [facility] 01/20/2021, 10:13 AM

## 2021-01-28 LAB — CYTOLOGY - PAP
Adequacy: ABSENT
Comment: NEGATIVE
Diagnosis: UNDETERMINED — AB
High risk HPV: NEGATIVE

## 2021-02-03 ENCOUNTER — Other Ambulatory Visit: Payer: Self-pay | Admitting: Obstetrics and Gynecology

## 2021-02-03 DIAGNOSIS — N6310 Unspecified lump in the right breast, unspecified quadrant: Secondary | ICD-10-CM

## 2021-03-13 ENCOUNTER — Other Ambulatory Visit: Payer: Self-pay

## 2021-03-13 ENCOUNTER — Ambulatory Visit: Payer: Self-pay | Admitting: Physician Assistant

## 2021-03-13 ENCOUNTER — Other Ambulatory Visit: Payer: Self-pay | Admitting: Physician Assistant

## 2021-03-13 ENCOUNTER — Encounter: Payer: Self-pay | Admitting: Physician Assistant

## 2021-03-13 ENCOUNTER — Ambulatory Visit
Admission: RE | Admit: 2021-03-13 | Discharge: 2021-03-13 | Disposition: A | Discharge status: Home / Self Care | Payer: 59 | Source: Ambulatory Visit | Attending: Physician Assistant | Admitting: Physician Assistant

## 2021-03-13 VITALS — BP 129/91 | HR 105 | Temp 97.6°F | Resp 12 | Ht 64.0 in | Wt 180.0 lb

## 2021-03-13 DIAGNOSIS — D252 Subserosal leiomyoma of uterus: Secondary | ICD-10-CM | POA: Diagnosis not present

## 2021-03-13 DIAGNOSIS — R399 Unspecified symptoms and signs involving the genitourinary system: Secondary | ICD-10-CM

## 2021-03-13 DIAGNOSIS — R1030 Lower abdominal pain, unspecified: Secondary | ICD-10-CM

## 2021-03-13 NOTE — Progress Notes (Signed)
   Subjective: Lower abdominal pain    Patient ID: Vanessa Bryan, female    DOB: 06/21/1976, 45 y.o.   MRN: [id number]  HPI Patient presents for cute onset of lower abdominal pain.  Patient also states the pain radiates to bilateral flank area.  Patient denies dysuria.  Patient denies hematuria.  Patient denies vaginal discharge.  No fevers associated with complaint.   Review of Systems Negative except for complaint.    Objective:   Physical Exam No acute distress.  Temperature 97.6, pulse 105, respiration 12, BP is 129/91, patient 90% O2 sat on room air.  Patient weighs 180 pounds and BMI is 30.9. Patient dip UA shows 2+ blood. Abdomen with negative HSM, normoactive bowel sounds, moderate guarding palpation center lower abdominal area.  No rebound in the right lower quadrant.      Assessment & Plan: Pelvic pain   Discussed rationale for further evaluation using noncontrast CT scan.  Patient will follow up status post scan.

## 2021-03-13 NOTE — Progress Notes (Signed)
Yesterday morning lower abd pain that radiates to lower back. Pt denies painful urination but states its sore to the touch on lower abd./ CL,RMA

## 2021-03-20 ENCOUNTER — Ambulatory Visit: Payer: Self-pay

## 2021-03-20 ENCOUNTER — Other Ambulatory Visit: Payer: Self-pay

## 2021-03-20 DIAGNOSIS — Z0000 Encounter for general adult medical examination without abnormal findings: Secondary | ICD-10-CM

## 2021-03-20 LAB — POCT URINALYSIS DIPSTICK
Bilirubin, UA: NEGATIVE
Blood, UA: NEGATIVE
Glucose, UA: NEGATIVE
Ketones, UA: NEGATIVE
Leukocytes, UA: NEGATIVE
Nitrite, UA: NEGATIVE
Protein, UA: NEGATIVE
Spec Grav, UA: 1.015 (ref 1.010–1.025)
Urobilinogen, UA: 0.2 E.U./dL
pH, UA: 6 (ref 5.0–8.0)

## 2021-03-21 LAB — CMP12+LP+TP+TSH+6AC+CBC/D/PLT
ALT: 44 IU/L — ABNORMAL HIGH (ref 0–32)
AST: 32 IU/L (ref 0–40)
Albumin/Globulin Ratio: 2 (ref 1.2–2.2)
Albumin: 4.5 g/dL (ref 3.8–4.8)
Alkaline Phosphatase: 93 IU/L (ref 44–121)
BUN/Creatinine Ratio: 12 (ref 9–23)
BUN: 9 mg/dL (ref 6–24)
Basophils Absolute: 0.1 10*3/uL (ref 0.0–0.2)
Basos: 1 %
Bilirubin Total: 0.2 mg/dL (ref 0.0–1.2)
Calcium: 9.4 mg/dL (ref 8.7–10.2)
Chloride: 101 mmol/L (ref 96–106)
Chol/HDL Ratio: 4.1 ratio (ref 0.0–4.4)
Cholesterol, Total: 165 mg/dL (ref 100–199)
Creatinine, Ser: 0.74 mg/dL (ref 0.57–1.00)
EOS (ABSOLUTE): 0.1 10*3/uL (ref 0.0–0.4)
Eos: 3 %
Estimated CHD Risk: 0.9 times avg. (ref 0.0–1.0)
Free Thyroxine Index: 1.6 (ref 1.2–4.9)
GGT: 60 IU/L (ref 0–60)
Globulin, Total: 2.2 g/dL (ref 1.5–4.5)
Glucose: 74 mg/dL (ref 65–99)
HDL: 40 mg/dL (ref >39)
Hematocrit: 41 % (ref 34.0–46.6)
Hemoglobin: 14 g/dL (ref 11.1–15.9)
Immature Grans (Abs): 0 10*3/uL (ref 0.0–0.1)
Immature Granulocytes: 0 %
Iron: 113 ug/dL (ref 27–159)
LDH: 166 IU/L (ref 119–226)
LDL Chol Calc (NIH): 82 mg/dL (ref 0–99)
Lymphocytes Absolute: 2.2 10*3/uL (ref 0.7–3.1)
Lymphs: 48 %
MCH: 33.5 pg — ABNORMAL HIGH (ref 26.6–33.0)
MCHC: 34.1 g/dL (ref 31.5–35.7)
MCV: 98 fL — ABNORMAL HIGH (ref 79–97)
Monocytes Absolute: 0.3 10*3/uL (ref 0.1–0.9)
Monocytes: 7 %
Neutrophils Absolute: 1.9 10*3/uL (ref 1.4–7.0)
Neutrophils: 41 %
Phosphorus: 3.2 mg/dL (ref 3.0–4.3)
Platelets: 344 10*3/uL (ref 150–450)
Potassium: 4.8 mmol/L (ref 3.5–5.2)
RBC: 4.18 x10E6/uL (ref 3.77–5.28)
RDW: 11.8 % (ref 11.7–15.4)
Sodium: 139 mmol/L (ref 134–144)
T3 Uptake Ratio: 24 % (ref 24–39)
T4, Total: 6.8 ug/dL (ref 4.5–12.0)
TSH: 2.45 u[IU]/mL (ref 0.450–4.500)
Total Protein: 6.7 g/dL (ref 6.0–8.5)
Triglycerides: 261 mg/dL — ABNORMAL HIGH (ref 0–149)
Uric Acid: 6.6 mg/dL — ABNORMAL HIGH (ref 2.6–6.2)
VLDL Cholesterol Cal: 43 mg/dL — ABNORMAL HIGH (ref 5–40)
WBC: 4.5 10*3/uL (ref 3.4–10.8)
eGFR: 103 mL/min/{1.73_m2} (ref >59)

## 2021-03-26 ENCOUNTER — Other Ambulatory Visit: Payer: Self-pay

## 2021-03-26 ENCOUNTER — Ambulatory Visit: Payer: Self-pay

## 2021-03-26 VITALS — BP 130/87

## 2021-03-26 DIAGNOSIS — Z0130 Encounter for examination of blood pressure without abnormal findings: Secondary | ICD-10-CM

## 2021-03-27 ENCOUNTER — Ambulatory Visit: Payer: Self-pay | Admitting: Physician Assistant

## 2021-03-27 ENCOUNTER — Encounter: Payer: Self-pay | Admitting: Obstetrics and Gynecology

## 2021-03-27 ENCOUNTER — Ambulatory Visit (INDEPENDENT_AMBULATORY_CARE_PROVIDER_SITE_OTHER): Payer: 59 | Admitting: Obstetrics and Gynecology

## 2021-03-27 ENCOUNTER — Encounter: Payer: Self-pay | Admitting: Physician Assistant

## 2021-03-27 VITALS — BP 118/70 | Ht 64.0 in | Wt 181.8 lb

## 2021-03-27 VITALS — BP 124/82 | HR 88 | Temp 98.0°F | Resp 14 | Ht 64.0 in | Wt 180.0 lb

## 2021-03-27 DIAGNOSIS — D252 Subserosal leiomyoma of uterus: Secondary | ICD-10-CM | POA: Diagnosis not present

## 2021-03-27 DIAGNOSIS — Z0000 Encounter for general adult medical examination without abnormal findings: Secondary | ICD-10-CM

## 2021-03-27 MED ORDER — ROSUVASTATIN CALCIUM 40 MG PO TABS
40.0000 mg | ORAL_TABLET | Freq: Every day | ORAL | 3 refills | Status: DC
Start: 2021-03-27 — End: 2022-03-31

## 2021-03-27 NOTE — Progress Notes (Signed)
Pt denies any issues or concerns at this time.CL,RMA

## 2021-03-27 NOTE — Progress Notes (Signed)
Patient ID: Vanessa Bryan, female   DOB: [birth year], 45 y.o.   MRN: [id number]  Reason for Consult: Gynecologic Exam   Referred by No ref. provider found  Subjective:     HPI:  Vanessa Bryan is a 45 y.o. female she reports that she was referred here today by her primary care physician.  Her primary care physician ordered a pelvic ultrasound when she was having some abdominal pain roughly a week ago.  She reports that her abdominal pain has resolved.  She was told that she has a fibroid and suspected cause of her pain.  She generally is not having issues with heavy bleeding.  She has an IUD in place.  Gynecological History  No LMP recorded. (Menstrual status: IUD).  Past Medical History:  Diagnosis Date   AMA (advanced maternal age) multigravida 35+ 2016   Anxiety    Gestational diabetes    Hypertensive heart disease 06/25/2020   Lupus ([place]) 45 years old   Family History  Problem Relation Age of Onset   Hypertension Mother    No past surgical history on file.  Short Social History:  Social History   Tobacco Use   Smoking status: Former    Types: Cigarettes   Smokeless tobacco: Never   Tobacco comments:    0-2 times per day  Substance Use Topics   Alcohol use: Yes    Alcohol/week: 0.0 standard drinks    Comment: occasionally    Allergies  Allergen Reactions   Topamax [Topiramate] Nausea Only    Current Outpatient Medications  Medication Sig Dispense Refill   cetirizine (ZYRTEC) 10 MG tablet Take 10 mg by mouth daily.      fluticasone (FLONASE) 50 MCG/ACT nasal spray Place 2 sprays into both nostrils daily. 16 g 6   losartan (COZAAR) 50 MG tablet Take 1 tablet (50 mg total) by mouth daily. 90 tablet 3   Multiple Vitamin (MULTIVITAMIN) tablet Take 1 tablet by mouth daily.     rosuvastatin (CRESTOR) 40 MG tablet Take 1 tablet (40 mg total) by mouth daily. 90 tablet 3   No current facility-administered medications for this visit.    Review of Systems   Constitutional: Negative for chills, fatigue, fever and unexpected weight change.  HENT: Negative for trouble swallowing.  Eyes: Negative for loss of vision.  Respiratory: Negative for cough, shortness of breath and wheezing.  Cardiovascular: Negative for chest pain, leg swelling, palpitations and syncope.  GI: Negative for abdominal pain, blood in stool, diarrhea, nausea and vomiting.  GU: Negative for difficulty urinating, dysuria, frequency and hematuria.  Musculoskeletal: Negative for back pain, leg pain and joint pain.  Skin: Negative for rash.  Neurological: Negative for dizziness, headaches, light-headedness, numbness and seizures.  Psychiatric: Negative for behavioral problem, confusion, depressed mood and sleep disturbance.       Objective:  Objective   Vitals:   03/27/21 1400  BP: 118/70  Weight: 181 lb 12.8 oz (82.5 kg)  Height: '5\' 4"'$  (1.626 m)   Body mass index is 31.21 kg/m.  Physical Exam Vitals and nursing note reviewed. Exam conducted with a chaperone present.  Constitutional:      Appearance: Normal appearance.  HENT:     Head: Normocephalic and atraumatic.  Eyes:     Extraocular Movements: Extraocular movements intact.     Pupils: Pupils are equal, round, and reactive to light.  Cardiovascular:     Rate and Rhythm: Normal rate and regular rhythm.  Pulmonary:  Effort: Pulmonary effort is normal.     Breath sounds: Normal breath sounds.  Abdominal:     General: Abdomen is flat.     Palpations: Abdomen is soft.  Musculoskeletal:     Cervical back: Normal range of motion.  Skin:    General: Skin is warm and dry.  Neurological:     General: No focal deficit present.     Mental Status: She is alert and oriented to person, place, and time.  Psychiatric:        Behavior: Behavior normal.        Thought Content: Thought content normal.        Judgment: Judgment normal.    Assessment/Plan:     45 y.o. [id number] with a small subserosal uterine  fibroid.   We discussed options for management of uterine fibroids.  We discussed that this can include hormonal medications such as an oral contraceptive pill, progesterone only pill, Depo-Provera, Nexplanon,  progesterone IUD.  We discussed surgical options including hysteroscopy with myomectomy for small submucosal fibroids, abdominal or laparoscopic myomectomy, uterine fibroid embolization, and hysterectomy.  We discussed the pros and cons of these different procedures and medical management options in detail.   We reviewed the options in detail and elected that continuing and IUD would best suit her needs. At this time she is not having significant symptoms of a fibroid uterus.  I provided her with an ACOG handout regarding fibroids.  Additionally reminded her of the need to schedule her mammogram.  Reviewed her prior 2020 results which were abnormal.  She was able to stop call and schedule appoint with with [facility]  More than 20 minutes were spent face to face with the patient in the room, reviewing the medical record, labs and images, and coordinating care for the patient. The plan of management was discussed in detail and counseling was provided.    Adrian Prows MD [facility], [facility] 03/27/2021 2:32 PM

## 2021-03-27 NOTE — Progress Notes (Signed)
   Subjective: Annual exam    Patient ID: Vanessa Bryan, female    DOB: 02/21/1976, 45 y.o.   MRN: [id number]  HPI Patient presents for annual exam voices no concerns or complaints.   Review of Systems Hypertension and seasonal rhinitis    Objective:   Physical Exam No acute distress.  Temperature is 98, pulse 88, respiration 14, BP is 124/82, and patient 98% O2 sat on room air.  Patient will 180 pounds and BMI is 30.9. HEENT is unremarkable.  Neck is supple for adenopathy or bruits.  Lungs are clear to auscultation. Heart is regular rate and rhythm.  No acute findings on EKG. Abdomen with negative HSM, hyperactive bowel sounds, soft, nontender to palpation. No obvious deformity to upper or lower extremities.  Patient has full and equal range of motion upper and lower extremities. No obvious deformity to to the cervical lumbar spine.  Patient has full and equal range of motion of the cervical lumbar spine. Cranial nerves II through XII are grossly intact.  DTR 2+ without clonus.       Assessment & Plan: Well exam.   Discussed lipid profile with patient.  There is an elevation of her triglycerides and VLDL.  There is also decrease in the HDL.  Patient is amenable to a trial of Crestor at 40 mg daily.  Repeat lipids in 6 months.

## 2021-04-16 ENCOUNTER — Ambulatory Visit
Admission: RE | Admit: 2021-04-16 | Discharge: 2021-04-16 | Disposition: A | Discharge status: Home / Self Care | Payer: 59 | Source: Ambulatory Visit | Attending: Obstetrics and Gynecology | Admitting: Obstetrics and Gynecology

## 2021-04-16 ENCOUNTER — Other Ambulatory Visit: Payer: Self-pay

## 2021-04-16 DIAGNOSIS — N6310 Unspecified lump in the right breast, unspecified quadrant: Secondary | ICD-10-CM | POA: Diagnosis not present

## 2021-04-16 DIAGNOSIS — R922 Inconclusive mammogram: Secondary | ICD-10-CM | POA: Diagnosis not present

## 2021-05-22 ENCOUNTER — Other Ambulatory Visit: Payer: Self-pay | Admitting: Nurse Practitioner

## 2021-05-22 DIAGNOSIS — I10 Essential (primary) hypertension: Secondary | ICD-10-CM

## 2021-06-17 ENCOUNTER — Ambulatory Visit: Payer: 59 | Admitting: Physician Assistant

## 2021-06-26 ENCOUNTER — Ambulatory Visit: Payer: Self-pay

## 2021-06-26 ENCOUNTER — Other Ambulatory Visit: Payer: Self-pay

## 2021-06-26 VITALS — BP 131/80 | HR 117 | Resp 14

## 2021-06-26 DIAGNOSIS — Z0130 Encounter for examination of blood pressure without abnormal findings: Secondary | ICD-10-CM

## 2021-06-26 NOTE — Progress Notes (Signed)
Pt presents today for BP check. Pt interested in coming in for 3 day BP check and scheduling an apt after to see provider. Vanessa Bryan

## 2021-09-17 ENCOUNTER — Other Ambulatory Visit: Payer: Self-pay

## 2021-09-17 NOTE — Telephone Encounter (Signed)
Received paper fax from [facility]) for advance refill approval request for Losartan 50 mg.  Upon entering Rx refill info into Epic, a box for a pended refill request for Losartan 50 mg was already in [place], sent electronically by patient's pharmacy.  Re-routed the pended Rx refill for Losartan to Randel Pigg, PA-C.  AMD

## 2021-09-24 ENCOUNTER — Other Ambulatory Visit: Payer: 59

## 2021-09-29 ENCOUNTER — Other Ambulatory Visit: Payer: Self-pay

## 2021-09-29 DIAGNOSIS — E782 Mixed hyperlipidemia: Secondary | ICD-10-CM

## 2021-09-29 NOTE — Progress Notes (Signed)
Pt presents today for 6 month lipid labs./CL,RMA ?

## 2021-09-29 NOTE — Addendum Note (Signed)
Addended by: Malachy Moan F on: 09/29/2021 10:10 AM ? ? Modules accepted: Orders ? ?

## 2021-09-30 LAB — LIPID PANEL
Chol/HDL Ratio: 1.9 ratio (ref 0.0–4.4)
Cholesterol, Total: 111 mg/dL (ref 100–199)
HDL: 58 mg/dL (ref >39)
LDL Chol Calc (NIH): 24 mg/dL (ref 0–99)
Triglycerides: 180 mg/dL — ABNORMAL HIGH (ref 0–149)
VLDL Cholesterol Cal: 29 mg/dL (ref 5–40)

## 2021-10-07 ENCOUNTER — Ambulatory Visit: Payer: Self-pay | Admitting: Physician Assistant

## 2021-10-07 ENCOUNTER — Encounter: Payer: Self-pay | Admitting: Physician Assistant

## 2021-10-07 ENCOUNTER — Other Ambulatory Visit: Payer: Self-pay

## 2021-10-07 VITALS — BP 129/90 | HR 97 | Temp 97.6°F | Resp 12 | Ht 64.0 in | Wt 178.0 lb

## 2021-10-07 DIAGNOSIS — E782 Mixed hyperlipidemia: Secondary | ICD-10-CM

## 2021-10-07 NOTE — Progress Notes (Signed)
Pt presents today for lab results. 

## 2021-10-07 NOTE — Progress Notes (Signed)
? ?  Subjective: Hyperlipidemia  ? ? Patient ID: Vanessa Bryan, female    DOB: Feb 02, 1976, 46 y.o.   MRN: [id number] ? ?HPI ? ?Patient presents for 69-monthfollow-up for hyperlipidemia.  Patient was started on Crestor 40 mg.  Patient denies any side effects with taking medications. ? ?Review of Systems ?Hyperlipidemia and hypertension ?   ?Objective:  ? Physical Exam ? ?Temperature is 97.6, pulse 97, respiration 12, BP is 129/90, and patient 90% O2 sat on room air.  Patient weighs 178 pounds and BMI is 30.55. ?Lipid panel ?Order: [id number]?Status: Final result    ?Visible to patient: Yes (seen)    ?Next appt: None    ?Dx: Elevated triglycerides with high chol...    ?0 Result Notes ?        ?Component Ref Range & Units 8 d ago 6 mo ago 11 mo ago 1 yr ago 2 yr ago  ?Cholesterol, Total 100 - 199 mg/dL 111  165  179  198  197   ?Triglycerides 0 - 149 mg/dL 180 High   261 High   230 High   178 High   182 High    ?HDL >39 mg/dL 58  40  45  44  51   ?VLDL Cholesterol Cal 5 - 40 mg/dL 29  43 High   39  32  32   ?LDL Chol Calc (NIH) 0 - 99 mg/dL 24  82  95  122 High   114 High    ?Chol/HDL Ratio 0.0 - 4.4 ratio 1.9  4.1 CM  4.0 CM  4.5 High  CM  3.9 CM   ?Comment:                                   T. Chol/HDL Ratio  ?                                            Men  Women  ?                              1/2 Avg.Risk  3.4    3.3  ?                                  Avg.Risk  5.0    4.4  ?                               2X Avg.Risk  9.6    7.1  ?                               3X Avg.Risk 23.4   11.0   ?Resulting Agency  [facility]  ?  ? ?  ?Narrative ?  ?  ? ?   ?Assessment & Plan: Hyperlipidemia  ?Patient advised continue medication we will follow-up in 6 months. ?

## 2021-12-26 ENCOUNTER — Other Ambulatory Visit: Payer: Self-pay | Admitting: Physician Assistant

## 2021-12-26 DIAGNOSIS — Z1231 Encounter for screening mammogram for malignant neoplasm of breast: Secondary | ICD-10-CM

## 2022-03-25 ENCOUNTER — Ambulatory Visit: Payer: Self-pay

## 2022-03-25 DIAGNOSIS — Z0000 Encounter for general adult medical examination without abnormal findings: Secondary | ICD-10-CM

## 2022-03-25 LAB — POCT URINALYSIS DIPSTICK
Bilirubin, UA: NEGATIVE
Blood, UA: NEGATIVE
Glucose, UA: NEGATIVE
Ketones, UA: NEGATIVE
Leukocytes, UA: NEGATIVE
Nitrite, UA: NEGATIVE
Protein, UA: NEGATIVE
Spec Grav, UA: >=1.03 — AB (ref 1.010–1.025)
Urobilinogen, UA: 0.2 E.U./dL
pH, UA: 5.5 (ref 5.0–8.0)

## 2022-03-25 NOTE — Progress Notes (Signed)
Pt presents today for physical labs, will return to clinic for scheduled physical./CL,RMA 

## 2022-03-26 LAB — CMP12+LP+TP+TSH+6AC+CBC/D/PLT
ALT: 28 IU/L (ref 0–32)
AST: 27 IU/L (ref 0–40)
Albumin/Globulin Ratio: 2.4 — ABNORMAL HIGH (ref 1.2–2.2)
Albumin: 5.1 g/dL — ABNORMAL HIGH (ref 3.9–4.9)
Alkaline Phosphatase: 98 IU/L (ref 44–121)
BUN/Creatinine Ratio: 12 (ref 9–23)
BUN: 10 mg/dL (ref 6–24)
Basophils Absolute: 0.1 10*3/uL (ref 0.0–0.2)
Basos: 1 %
Bilirubin Total: 0.3 mg/dL (ref 0.0–1.2)
Calcium: 10.1 mg/dL (ref 8.7–10.2)
Chloride: 101 mmol/L (ref 96–106)
Chol/HDL Ratio: 3.6 ratio (ref 0.0–4.4)
Cholesterol, Total: 199 mg/dL (ref 100–199)
Creatinine, Ser: 0.85 mg/dL (ref 0.57–1.00)
EOS (ABSOLUTE): 0.2 10*3/uL (ref 0.0–0.4)
Eos: 4 %
Estimated CHD Risk: 0.6 times avg. (ref 0.0–1.0)
Free Thyroxine Index: 2 (ref 1.2–4.9)
GGT: 78 IU/L — ABNORMAL HIGH (ref 0–60)
Globulin, Total: 2.1 g/dL (ref 1.5–4.5)
Glucose: 87 mg/dL (ref 70–99)
HDL: 55 mg/dL (ref >39)
Hematocrit: 42.7 % (ref 34.0–46.6)
Hemoglobin: 14.9 g/dL (ref 11.1–15.9)
Immature Grans (Abs): 0 10*3/uL (ref 0.0–0.1)
Immature Granulocytes: 0 %
Iron: 106 ug/dL (ref 27–159)
LDH: 150 IU/L (ref 119–226)
LDL Chol Calc (NIH): 94 mg/dL (ref 0–99)
Lymphocytes Absolute: 2.1 10*3/uL (ref 0.7–3.1)
Lymphs: 40 %
MCH: 33.6 pg — ABNORMAL HIGH (ref 26.6–33.0)
MCHC: 34.9 g/dL (ref 31.5–35.7)
MCV: 96 fL (ref 79–97)
Monocytes Absolute: 0.3 10*3/uL (ref 0.1–0.9)
Monocytes: 6 %
Neutrophils Absolute: 2.6 10*3/uL (ref 1.4–7.0)
Neutrophils: 49 %
Phosphorus: 3.6 mg/dL (ref 3.0–4.3)
Platelets: 302 10*3/uL (ref 150–450)
Potassium: 4.6 mmol/L (ref 3.5–5.2)
RBC: 4.43 x10E6/uL (ref 3.77–5.28)
RDW: 12.2 % (ref 11.7–15.4)
Sodium: 141 mmol/L (ref 134–144)
T3 Uptake Ratio: 25 % (ref 24–39)
T4, Total: 8.1 ug/dL (ref 4.5–12.0)
TSH: 2.51 u[IU]/mL (ref 0.450–4.500)
Total Protein: 7.2 g/dL (ref 6.0–8.5)
Triglycerides: 299 mg/dL — ABNORMAL HIGH (ref 0–149)
Uric Acid: 6.2 mg/dL (ref 2.6–6.2)
VLDL Cholesterol Cal: 50 mg/dL — ABNORMAL HIGH (ref 5–40)
WBC: 5.3 10*3/uL (ref 3.4–10.8)
eGFR: 87 mL/min/{1.73_m2} (ref >59)

## 2022-03-31 ENCOUNTER — Ambulatory Visit: Payer: 59 | Admitting: Physician Assistant

## 2022-03-31 ENCOUNTER — Encounter: Payer: Self-pay | Admitting: Physician Assistant

## 2022-03-31 VITALS — BP 138/101 | HR 94 | Temp 97.8°F | Resp 14 | Ht 64.0 in | Wt 179.0 lb

## 2022-03-31 DIAGNOSIS — M659 Synovitis and tenosynovitis, unspecified: Secondary | ICD-10-CM

## 2022-03-31 DIAGNOSIS — Z0000 Encounter for general adult medical examination without abnormal findings: Secondary | ICD-10-CM

## 2022-03-31 DIAGNOSIS — M65931 Unspecified synovitis and tenosynovitis, right forearm: Secondary | ICD-10-CM

## 2022-03-31 DIAGNOSIS — M67431 Ganglion, right wrist: Secondary | ICD-10-CM

## 2022-03-31 MED ORDER — ROSUVASTATIN CALCIUM 40 MG PO TABS: 40.0000 mg | ORAL_TABLET | Freq: Every day | ORAL | 3 refills | Status: DC

## 2022-03-31 NOTE — Progress Notes (Signed)
[facility]  ____________________________________________   None    (approximate)  I have reviewed the triage vital signs and the nursing notes.   HISTORY  Chief Complaint Annual Exam   HPI Vanessa Bryan is a 46 y.o. female patient presents for annual physical exam.  Patient complaining of right wrist pain.  States pain increases with typing and using the mouse.  Patient stated no relief with ergonomic mouse pad.  Patient is right-hand dominant.  Patient admits to noncompliance of hyperlipidemia medication secondary to running out of refills.  Patient not taking medication in 2 months.  Patient also states she has not taken her hypertension medicine today.         Past Medical History:  Diagnosis Date   AMA (advanced maternal age) multigravida 35+ 2016   Anxiety    Gestational diabetes    Hypertensive heart disease 06/25/2020   Lupus ([place]) 46 years old    Patient Active Problem List   Diagnosis Date Noted   Hypertensive heart disease 06/25/2020   COVID-19 04/30/2020   Overweight 05/09/2019   Migraine 05/09/2019   Allergic rhinitis 05/09/2019   Fatigue 05/09/2019   Cervical radiculopathy 05/21/2016   Labor and delivery indication for care or intervention 09/24/2015   Ruptured, membranes, premature 09/24/2015   Abnormal MSAFP (maternal serum alpha-fetoprotein), elevated 05/16/2015    No past surgical history on file.  Prior to Admission medications   Medication Sig Start Date End Date Taking? Authorizing Provider  cetirizine (ZYRTEC) 10 MG tablet Take 10 mg by mouth daily.    Yes [provider]  fluticasone (FLONASE) 50 MCG/ACT nasal spray Place 2 sprays into both nostrils daily. 06/06/20  Yes Neese, Temperance, NP  losartan (COZAAR) 50 MG tablet Take 1 tablet (50 mg total) by mouth daily. 09/17/21  Yes Sable Feil, PA-C  Multiple Vitamin (MULTIVITAMIN) tablet Take 1 tablet by mouth daily.   Yes [provider]  rosuvastatin (CRESTOR) 40 MG tablet Take 1 tablet (40 mg total) by mouth daily. 03/27/21  Yes Sable Feil, PA-C    Allergies Topamax [topiramate]  Family History  Problem Relation Age of Onset   Hypertension Mother    Breast cancer Neg Hx     Social History Social History   Tobacco Use   Smoking status: Former    Types: Cigarettes   Smokeless tobacco: Never   Tobacco comments:    0-2 times per day  Vaping Use   Vaping Use: Every day   Substances: Nicotine, Flavoring  Substance Use Topics   Alcohol use: Yes    Alcohol/week: 0.0 standard drinks of alcohol    Comment: occasionally   Drug use: No    Review of Systems  Constitutional: No fever/chills Eyes: No visual changes. ENT: No sore throat. Cardiovascular: Denies chest pain. Respiratory: Denies shortness of breath. Gastrointestinal: No abdominal pain.  No nausea, no vomiting.  No diarrhea.  No constipation. Genitourinary: Negative for dysuria. Musculoskeletal: Negative for back pain. Skin: Negative for rash. Neurological: Negative for headaches, focal weakness or numbness. Psychiatric: Anxiety Endocrine: Hyperlipidemia and hypertension  Allergic/Immunilogical: Topamax ____________________________________________   PHYSICAL EXAM:  VITAL SIGNS: BP is 138/101.  Pulse is 94, respiration 14, temperature is 97.8, patient 1% O2 sat on room air.  Patient weighs 179 pounds and BMI is 30.73. Constitutional: Alert and oriented. Well appearing and in no acute distress. Eyes: Conjunctivae are normal. PERRL. EOMI. Head: Atraumatic. Nose: No congestion/rhinnorhea. Mouth/Throat: Mucous membranes are  moist.  Oropharynx non-erythematous. Neck: No stridor.  No cervical spine tenderness to palpation. Hematological/Lymphatic/Immunilogical: No cervical lymphadenopathy. Cardiovascular: Normal rate, regular rhythm. Grossly normal heart sounds.  Elevated blood pressure.  Good peripheral circulation. Respiratory:  Normal respiratory effort.  No retractions. Lungs CTAB. Gastrointestinal: Soft and nontender. No distention. No abdominal bruits. No CVA tenderness. Genitourinary: Deferred Musculoskeletal: No obvious deformity to the right wrist.  Patient has a palpable nodule lesion dorsal aspect of right wrist.  Patient has free Nikkel range of motion.  Sensation no lower extremity tenderness nor edema.  No joint effusions. Neurologic:  Normal speech and language. No gross focal neurologic deficits are appreciated. No gait instability. Skin:  Skin is warm, dry and intact. No rash noted. Psychiatric: Mood and affect are normal. Speech and behavior are normal.  ____________________________________________   LABS        Component Ref Range & Units 6 d ago (03/25/22) 1 yr ago (03/20/21) 1 yr ago (04/11/20) 2 yr ago (04/28/19)  Color, UA  yellow  Light Yellow  yellow  Yellow   Clarity, UA  cloudy  Clear  cloudy  Clear   Glucose, UA Negative Negative  Negative  Negative  Negative   Bilirubin, UA  negatve  Negative  negative  Negative   Ketones, UA  negative  Negative  negative  Negative   Spec Grav, UA 1.010 - 1.025 >=1.030 Abnormal   1.015  >=1.030 Abnormal   1.025   Blood, UA  negative  Negative  +- CM  Negative   pH, UA 5.0 - 8.0 5.5  6.0  5.5  6.0   Protein, UA Negative Negative  Negative  Negative  Negative   Urobilinogen, UA 0.2 or 1.0 E.U./dL 0.2  0.2  0.2  0.2   Nitrite, UA  negative  Negative  negative  Negative   Leukocytes, UA Negative Negative  Negative  Negative  Negative   Appearance     dark     Odor               Contains abnormal data CMP12+LP+TP+TSH+6AC+CBC/D/Plt Order: [id number] Status: Final result    Visible to patient: Yes (seen)    Next appt: None    Dx: Routine adult health maintenance    0 Result Notes           Component Ref Range & Units 6 d ago (03/25/22) 6 mo ago (09/29/21) 1 yr ago (03/20/21) 1 yr ago (10/17/20) 1 yr ago (04/11/20) 2 yr ago (04/28/19) 6 yr ago (09/26/15)   Glucose 70 - 99 mg/dL 87   74 R   87 R  91 R    Uric Acid 2.6 - 6.2 mg/dL 6.2   6.6 High  CM   7.3 High  CM  7.2 High  R, CM    Comment:            Therapeutic target for gout patients: <6.0  BUN 6 - 24 mg/dL _0 Creatinine, Ser 0.57 - 1.00 mg/dL 0.85   0.74   0.88  0.74    eGFR >59 mL/min/1.73 87   103       BUN/Creatinine Ratio 9 - _1 Sodium 134 - 144 mmol/L 141   139   142  139    Potassium 3.5 - 5.2 mmol/L 4.6   4.8   5.5 High  4.9    Chloride 96 - 106 mmol/L 101   101   102  101    Calcium 8.7 - 10.2 mg/dL 10.1   9.4   10.4 High   10.4 High     Phosphorus 3.0 - 4.3 mg/dL 3.6   3.2   4.1  4.1    Total Protein 6.0 - 8.5 g/dL 7.2   6.7   7.4  7.6    Albumin 3.9 - 4.9 g/dL 5.1 High    4.5 R   4.9 High  R  5.0 High  R    Globulin, Total 1.5 - 4.5 g/dL 2.1   2.2   2.5  2.6    Albumin/Globulin Ratio 1.2 - 2.2 2.4 High    2.0   2.0  1.9    Bilirubin Total 0.0 - 1.2 mg/dL 0.3   0.2   0.2  0.3    Alkaline Phosphatase 44 - 121 IU/L 98   93   87 CM  97 R    LDH 119 - 226 IU/L 150   166   163  152    AST 0 - 40 IU/L 27   32   31  32    ALT 0 - 32 IU/L 28   44 High    42 High   38 High     GGT 0 - 60 IU/L 78 High    60   49  67 High     Iron 27 - 159 ug/dL 106   113   97  93    Cholesterol, Total 100 - 199 mg/dL 199  111  165  179  198  197    Triglycerides 0 - 149 mg/dL 299 High   180 High   261 High   230 High   178 High   182 High     HDL >39 mg/dL 55  58  40  45  44  51    VLDL Cholesterol Cal 5 - 40 mg/dL 50 High   29  43 High   39  32  32    LDL Chol Calc (NIH) 0 - 99 mg/dL 94  24  82  95  122 High   114 High     Chol/HDL Ratio 0.0 - 4.4 ratio 3.6  1.9 CM  4.1 CM  4.0 CM  4.5 High  CM  3.9 CM    Comment:                                   T. Chol/HDL Ratio                                              Men  Women                                1/2 Avg.Risk  3.4    3.3                                    Avg.Risk  5.0    4.4  2X Avg.Risk  9.6    7.1                                 3X Avg.Risk 23.4   11.0   Estimated CHD Risk 0.0 - 1.0 times avg. 0.6   0.9 CM   1.1 High  CM  0.8 CM    Comment: The CHD Risk is based on the T. Chol/HDL ratio. Other  factors affect CHD Risk such as hypertension, smoking,  diabetes, severe obesity, and family history of  premature CHD.   TSH 0.450 - 4.500 uIU/mL 2.510   2.450   2.270  2.730    T4, Total 4.5 - 12.0 ug/dL 8.1   6.8   6.5  6.7    T3 Uptake Ratio 24 - 39 % _0 Free Thyroxine Index 1.2 - 4.9 2.0   1.6   1.6  1.7    WBC 3.4 - 10.8 x10E3/uL 5.3   4.5   5.1  6.2  10.1 R   RBC 3.77 - 5.28 x10E6/uL 4.43   4.18   4.43  4.21  3.04 Low  R   Hemoglobin 11.1 - 15.9 g/dL 14.9   14.0   15.0  14.4  9.7 Low  R   Hematocrit 34.0 - 46.6 % 42.7   41.0   43.0  41.4  28.9 Low  R   MCV 79 - 97 fL 96   98 High    97  98 High   95.1 R   MCH 26.6 - 33.0 pg 33.6 High    33.5 High    33.9 High   34.2 High   31.9 R   MCHC 31.5 - 35.7 g/dL 34.9   34.1   34.9  34.8  33.5 R   RDW 11.7 - 15.4 % 12.2   11.8   11.5 Low   12.0  15.2 High  R   Platelets 150 - 450 x10E3/uL 302   344   300  310  234 R   Neutrophils Not Estab. % 49   41   49  51    Lymphs Not Estab. % 40   48   38  36    Monocytes Not Estab. % _1 Eos Not Estab. % _2 Basos Not Estab. % _3 Neutrophils Absolute 1.4 - 7.0 x10E3/uL 2.6   1.9   2.5  3.2    Lymphocytes Absolute 0.7 - 3.1 x10E3/uL 2.1   2.2   2.0  2.2    Monocytes Absolute 0.1 - 0.9 x10E3/uL 0.3   0.3   0.4  0.4    EOS (ABSOLUTE) 0.0 - 0.4 x10E3/uL 0.2   0.1   0.2  0.3    Basophils Absolute 0.0 - 0.2 x10E3/uL 0.1   0.1   0.1  0.1    Immature Granulocytes Not Estab. % 0   0   0  0    Immature Grans              ____________________________________________  EKG  Sinus rhythm at 77 bpm.  Left atrial  enlargement. ____________________________________________    ____________________________________________   INITIAL IMPRESSION / ASSESSMENT AND  PLAN  As part of my medical decision making, I reviewed the following data within the [place]      Discussed EKG findings and lab results with patient.  Patient will restart Crestor 40 mg and follow-up in 3 months.        ____________________________________________   FINAL CLINICAL IMPRESSION  Well exam.  Ganglion cyst and tenosynovitis of right wrist.  ED Discharge Orders     None        Note:  This document was prepared using Dragon voice recognition software and may include unintentional dictation errors.

## 2022-03-31 NOTE — Progress Notes (Signed)
Pt presents today to complete physical. Pt wants to talk about her Ekg and right wrist pain.

## 2022-07-06 ENCOUNTER — Other Ambulatory Visit: Payer: Self-pay

## 2022-07-06 DIAGNOSIS — I10 Essential (primary) hypertension: Secondary | ICD-10-CM

## 2022-07-06 MED ORDER — LOSARTAN POTASSIUM 50 MG PO TABS: 50.0000 mg | ORAL_TABLET | Freq: Every day | ORAL | 3 refills | Status: DC

## 2022-07-18 DIAGNOSIS — J069 Acute upper respiratory infection, unspecified: Secondary | ICD-10-CM | POA: Diagnosis not present

## 2022-07-26 DIAGNOSIS — H9201 Otalgia, right ear: Secondary | ICD-10-CM | POA: Diagnosis not present

## 2022-08-19 ENCOUNTER — Ambulatory Visit: Payer: Self-pay | Admitting: Physician Assistant

## 2022-08-19 VITALS — BP 137/86 | HR 83 | Temp 97.3°F | Resp 14 | Wt 179.0 lb

## 2022-08-19 DIAGNOSIS — B001 Herpesviral vesicular dermatitis: Secondary | ICD-10-CM

## 2022-08-19 MED ORDER — PENCICLOVIR 1 % EX CREA
1.0000 | TOPICAL_CREAM | CUTANEOUS | 3 refills | Status: DC
Start: 2022-08-19 — End: 2023-01-14

## 2022-08-19 NOTE — Progress Notes (Signed)
Stated x 1 week+ of cold sore on lower lip noted and stated it keeps breaking open and bothering her inquiring about meds to tx.

## 2022-08-19 NOTE — Progress Notes (Signed)
   Subjective: Cold sore    Patient ID: Vanessa Bryan, female    DOB: May 04, 1976, 47 y.o.   MRN: [id number]  HPI Patient complain recurrent cold sores to the right lower lip.  Requesting antiviral ointment for complaint.   Review of Systems     Objective:   Physical Exam  BP is 137/86, pulse 83, respiration 14, temperature 97.3, patient 1% O2 sat on room air.  Patient was on 79 pounds BMI is 30.73. Examination the right lower lip shows a scabbed over vesicular lesion.      Assessment & Plan: Cold sore   Patient given prescription for Denavir ointment apply as directed.  Follow-up as necessary.

## 2022-12-15 ENCOUNTER — Ambulatory Visit: Payer: 59 | Admitting: Obstetrics and Gynecology

## 2022-12-23 ENCOUNTER — Other Ambulatory Visit: Payer: Self-pay | Admitting: Physician Assistant

## 2022-12-23 DIAGNOSIS — Z1231 Encounter for screening mammogram for malignant neoplasm of breast: Secondary | ICD-10-CM

## 2023-01-13 NOTE — Progress Notes (Unsigned)
PCP:  Patient, No Pcp Per   No chief complaint on file.    HPI:      [name] is a 47 y.o. [id number] whose LMP was No LMP recorded. (Menstrual status: IUD)., presents today for her annual examination.  Her menses are {norm/abn:715}, lasting {number: 22536} days.  Dysmenorrhea {dysmen:716}. She {does:18564} have intermenstrual bleeding.  Sex activity: {sex active: 315163}.  Last Pap: 01/20/21 Results were: ASCUS with NEGATIVE high risk HPV ; repeat pap due Hx of STDs: {STD hx:14358}  Last mammogram: 04/16/21 Results were: normal--routine follow-up in 12 months. Has appt There is no FH of breast cancer. There is no FH of ovarian cancer. The patient {does:18564} do self-breast exams.  Tobacco use: {tob:20664} Alcohol use: {Alcohol:11675} No drug use.  Exercise: {exercise:31265}  She {does:18564} get adequate calcium and Vitamin D in her diet.  Patient Active Problem List   Diagnosis Date Noted   Hypertensive heart disease 06/25/2020   COVID-19 04/30/2020   Overweight 05/09/2019   Migraine 05/09/2019   Allergic rhinitis 05/09/2019   Fatigue 05/09/2019   Cervical radiculopathy 05/21/2016   Labor and delivery indication for care or intervention 09/24/2015   Ruptured, membranes, premature 09/24/2015   Abnormal MSAFP (maternal serum alpha-fetoprotein), elevated 05/16/2015    No past surgical history on file.  Family History  Problem Relation Age of Onset   Hypertension Mother    Breast cancer Neg Hx     Social History   Socioeconomic History   Marital status: Married    Spouse name: Not on file   Number of children: Not on file   Years of education: Not on file   Highest education level: Not on file  Occupational History   Not on file  Tobacco Use   Smoking status: Former    Types: Cigarettes   Smokeless tobacco: Never   Tobacco comments:    0-2 times per day  Vaping Use   Vaping Use: Every day   Substances: Nicotine, Flavoring  Substance and  Sexual Activity   Alcohol use: Yes    Alcohol/week: 0.0 standard drinks of alcohol    Comment: occasionally   Drug use: No   Sexual activity: Yes    Birth control/protection: I.U.D.  Other Topics Concern   Not on file  Social History Narrative   Not on file   Social Determinants of Health   Financial Resource Strain: Not on file  Food Insecurity: Not on file  Transportation Needs: Not on file  Physical Activity: Not on file  Stress: Not on file  Social Connections: Not on file  Intimate Partner Violence: Not on file     Current Outpatient Medications:    cetirizine (ZYRTEC) 10 MG tablet, Take 10 mg by mouth daily. , Disp: , Rfl:    fluticasone (FLONASE) 50 MCG/ACT nasal spray, Place 2 sprays into both nostrils daily. (Patient not taking: Reported on 08/19/2022), Disp: 16 g, Rfl: 6   losartan (COZAAR) 50 MG tablet, Take 1 tablet (50 mg total) by mouth daily., Disp: 90 tablet, Rfl: 3   Multiple Vitamin (MULTIVITAMIN) tablet, Take 1 tablet by mouth daily. (Patient not taking: Reported on 08/19/2022), Disp: , Rfl:    penciclovir (DENAVIR) 1 % cream, Apply 1 Application topically every 3 (three) hours while awake., Disp: 1.5 g, Rfl: 3   rosuvastatin (CRESTOR) 40 MG tablet, Take 1 tablet (40 mg total) by mouth daily., Disp: 90 tablet, Rfl: 3     ROS:  Review of Systems  BREAST: No symptoms   Objective: There were no vitals taken for this visit.   OBGyn Exam  Results: No results found for this or any previous visit (from the past 24 hour(s)).  Assessment/Plan: No diagnosis found.  No orders of the defined types were placed in this encounter.            GYN counsel {counseling: 16159}     F/U  No follow-ups on file.  Bryen Hinderman B. Malayjah Otoole, PA-C 01/13/2023 9:05 PM

## 2023-01-14 ENCOUNTER — Other Ambulatory Visit (HOSPITAL_COMMUNITY)
Admission: RE | Admit: 2023-01-14 | Discharge: 2023-01-14 | Disposition: A | Discharge status: Home / Self Care | Payer: 59 | Source: Ambulatory Visit | Attending: Obstetrics and Gynecology | Admitting: Obstetrics and Gynecology

## 2023-01-14 ENCOUNTER — Ambulatory Visit (INDEPENDENT_AMBULATORY_CARE_PROVIDER_SITE_OTHER): Payer: 59 | Admitting: Obstetrics and Gynecology

## 2023-01-14 ENCOUNTER — Encounter: Payer: Self-pay | Admitting: Obstetrics and Gynecology

## 2023-01-14 VITALS — BP 106/70 | Ht 64.0 in | Wt 180.0 lb

## 2023-01-14 DIAGNOSIS — Z01419 Encounter for gynecological examination (general) (routine) without abnormal findings: Secondary | ICD-10-CM | POA: Diagnosis not present

## 2023-01-14 DIAGNOSIS — Z1151 Encounter for screening for human papillomavirus (HPV): Secondary | ICD-10-CM | POA: Insufficient documentation

## 2023-01-14 DIAGNOSIS — Z124 Encounter for screening for malignant neoplasm of cervix: Secondary | ICD-10-CM | POA: Diagnosis not present

## 2023-01-14 DIAGNOSIS — Z30431 Encounter for routine checking of intrauterine contraceptive device: Secondary | ICD-10-CM

## 2023-01-14 DIAGNOSIS — Z1231 Encounter for screening mammogram for malignant neoplasm of breast: Secondary | ICD-10-CM

## 2023-01-14 NOTE — Patient Instructions (Addendum)
I value your feedback and you entrusting us with your care. If you get a [facility] patient survey, I would appreciate you taking the time to let us know about your experience today. Thank you! ? ? ?

## 2023-01-19 LAB — CYTOLOGY - PAP
Adequacy: ABSENT
Comment: NEGATIVE
Diagnosis: NEGATIVE
Diagnosis: REACTIVE
Diagnosis: REACTIVE
High risk HPV: NEGATIVE

## 2023-02-03 ENCOUNTER — Emergency Department: Payer: 59

## 2023-02-03 ENCOUNTER — Emergency Department
Admission: EM | Admit: 2023-02-03 | Discharge: 2023-02-03 | Disposition: A | Discharge status: Home / Self Care | Payer: 59 | Attending: Emergency Medicine | Admitting: Emergency Medicine

## 2023-02-03 ENCOUNTER — Other Ambulatory Visit: Payer: Self-pay

## 2023-02-03 ENCOUNTER — Encounter: Payer: Self-pay | Admitting: Emergency Medicine

## 2023-02-03 DIAGNOSIS — R002 Palpitations: Secondary | ICD-10-CM | POA: Diagnosis not present

## 2023-02-03 DIAGNOSIS — R0602 Shortness of breath: Secondary | ICD-10-CM | POA: Diagnosis not present

## 2023-02-03 DIAGNOSIS — R079 Chest pain, unspecified: Secondary | ICD-10-CM | POA: Diagnosis not present

## 2023-02-03 LAB — CBC
HCT: 42.1 % (ref 36.0–46.0)
Hemoglobin: 14.2 g/dL (ref 12.0–15.0)
MCH: 33 pg (ref 26.0–34.0)
MCHC: 33.7 g/dL (ref 30.0–36.0)
MCV: 97.9 fL (ref 80.0–100.0)
Platelets: 288 10*3/uL (ref 150–400)
RBC: 4.3 MIL/uL (ref 3.87–5.11)
RDW: 12.3 % (ref 11.5–15.5)
WBC: 6.7 10*3/uL (ref 4.0–10.5)
nRBC: 0 % (ref 0.0–0.2)

## 2023-02-03 LAB — BASIC METABOLIC PANEL
Anion gap: 9 (ref 5–15)
BUN: 11 mg/dL (ref 6–20)
CO2: 25 mmol/L (ref 22–32)
Calcium: 9.1 mg/dL (ref 8.9–10.3)
Chloride: 103 mmol/L (ref 98–111)
Creatinine, Ser: 0.82 mg/dL (ref 0.44–1.00)
GFR, Estimated: >60 mL/min (ref >60)
Glucose, Bld: 144 mg/dL — ABNORMAL HIGH (ref 70–99)
Potassium: 3.6 mmol/L (ref 3.5–5.1)
Sodium: 137 mmol/L (ref 135–145)

## 2023-02-03 LAB — TROPONIN I (HIGH SENSITIVITY): Troponin I (High Sensitivity): 2 ng/L (ref <18)

## 2023-02-03 MED ORDER — IOHEXOL 350 MG/ML SOLN
75.0000 mL | Freq: Once | INTRAVENOUS | Status: AC | PRN
  Administered 2023-02-03: 75 mL via INTRAVENOUS

## 2023-02-03 NOTE — ED Provider Notes (Signed)
[facility] Provider Note    Event Date/Time   First MD Initiated Contact with Patient 02/03/23 1327     (approximate)   History   Palpitations   HPI  Vanessa Bryan is a 47 y.o. female who presents with complaints of palpitations and a sensation of having an difficult time getting a full breath.  She reports this been going on for a few days now.  No lightheadedness.  No chest pain reported.  No lower extremity swelling or pain.  No history of DVT.     Physical Exam   Triage Vital Signs: ED Triage Vitals  Enc Vitals Group     BP 02/03/23 1321 (!) 188/112     Pulse Rate 02/03/23 1321 (!) 117     Resp 02/03/23 1321 18     Temp 02/03/23 1321 98.2 F (36.8 C)     Temp Source 02/03/23 1321 Oral     SpO2 02/03/23 1321 100 %     Weight 02/03/23 1319 81.6 kg (180 lb)     Height 02/03/23 1319 1.626 m (5\' 4" )     Head Circumference --      Peak Flow --      Pain Score 02/03/23 1319 0     Pain Loc --      Pain Edu? --      Excl. in GC? --     Most recent vital signs: Vitals:   02/03/23 1321  BP: (!) 188/112  Pulse: (!) 117  Resp: 18  Temp: 98.2 F (36.8 C)  SpO2: 100%     General: Awake, no distress.  CV:  Good peripheral perfusion. rrr Resp:  Normal effort.  Abd:  No distention.  Other:  No leg pain or swelling   ED Results / Procedures / Treatments   Labs (all labs ordered are listed, but only abnormal results are displayed) Labs Reviewed  BASIC METABOLIC PANEL - Abnormal; Notable for the following components:      Result Value   Glucose, Bld 144 (*)    All other components within normal limits  CBC  POC URINE PREG, ED  TROPONIN I (HIGH SENSITIVITY)     EKG  ED ECG REPORT I, Jene Every, the attending physician, personally viewed and interpreted this ECG.  Date: 02/03/2023 EKG Time:  Rate:  Rhythm: normal sinus rhythm QRS Axis: normal Intervals: normal ST/T Wave abnormalities: normal Narrative  Interpretation: no evidence of acute ischemia    RADIOLOGY Cxr viewed interpreted by me no acute abnormalities    PROCEDURES:  Critical Care performed:   Procedures   MEDICATIONS ORDERED IN ED: Medications  iohexol (OMNIPAQUE) 350 MG/ML injection 75 mL (75 mLs Intravenous Contrast Given 02/03/23 1432)     IMPRESSION / MDM / ASSESSMENT AND PLAN / ED COURSE  I reviewed the triage vital signs and the nursing notes. Patient's presentation is most consistent with acute presentation with potential threat to life or bodily function.  Patient with palpitations/tachycardia with sob. Differential includes PVCs, arrythmia, PE  Overall well appearing and in nad. No abnormalities noted on EKG or monitor. Suspicious for PVCs  Lab work normal  Sent for CTA to rule out PE  CT negative, patient feels well, HR normalized.   No indication for admission at this time appropriate for outpatient f/u with cards, strict return precautions.        FINAL CLINICAL IMPRESSION(S) / ED DIAGNOSES   Final diagnoses:  Heart palpitations  Rx / DC Orders   ED Discharge Orders          Ordered    Ambulatory referral to Cardiology       Comments: If you have not heard from the Cardiology office within the next 72 hours please call [phone].   02/03/23 1512             Note:  This document was prepared using Dragon voice recognition software and may include unintentional dictation errors.   Jene Every, MD 02/03/23 [phone]

## 2023-02-03 NOTE — ED Triage Notes (Signed)
Patient to ED via [facility] for intermittent heart palpations with SOB x3 days. Denies cardiac history. Denies pain at this time. Hx of anxiety.

## 2023-02-11 ENCOUNTER — Ambulatory Visit (INDEPENDENT_AMBULATORY_CARE_PROVIDER_SITE_OTHER): Payer: 59

## 2023-02-11 ENCOUNTER — Encounter: Payer: Self-pay | Admitting: Cardiology

## 2023-02-11 ENCOUNTER — Ambulatory Visit: Payer: 59 | Attending: Cardiology | Admitting: Cardiology

## 2023-02-11 VITALS — BP 198/80 | HR 93 | Ht 64.0 in | Wt 180.0 lb

## 2023-02-11 DIAGNOSIS — E782 Mixed hyperlipidemia: Secondary | ICD-10-CM | POA: Diagnosis not present

## 2023-02-11 DIAGNOSIS — I10 Essential (primary) hypertension: Secondary | ICD-10-CM

## 2023-02-11 DIAGNOSIS — R002 Palpitations: Secondary | ICD-10-CM | POA: Diagnosis not present

## 2023-02-11 NOTE — Patient Instructions (Signed)
Medication Instructions:   Your physician recommends that you continue on your current medications as directed. Please refer to the Current Medication list given to you today.  *If you need a refill on your cardiac medications before your next appointment, please call your pharmacy*   Lab Work:  None Ordered  If you have labs (blood work) drawn today and your tests are completely normal, you will receive your results only by: MyChart Message (if you have MyChart) OR A paper copy in the mail If you have any lab test that is abnormal or we need to change your treatment, we will call you to review the results.   Testing/Procedures:  Your physician has recommended that you wear a Zio monitor.   This monitor is a medical device that records the heart's electrical activity. Doctors most often use these monitors to diagnose arrhythmias. Arrhythmias are problems with the speed or rhythm of the heartbeat. The monitor is a small device applied to your chest. You can wear one while you do your normal daily activities. While wearing this monitor if you have any symptoms to push the button and record what you felt. Once you have worn this monitor for the period of time provider prescribed (Usually 14 days), you will return the monitor device in the postage paid box. Once it is returned they will download the data collected and provide [place] with a report which the provider will then review and we will call you with those results. Important tips:  Avoid showering during the first 24 hours of wearing the monitor. Avoid excessive sweating to help maximize wear time. Do not submerge the device, no hot tubs, and no swimming pools. Keep any lotions or oils away from the patch. After 24 hours you may shower with the patch on. Take brief showers with your back facing the shower head.  Do not remove patch once it has been placed because that will interrupt data and decrease adhesive wear time. Push the button  when you have any symptoms and write down what you were feeling. Once you have completed wearing your monitor, remove and place into box which has postage paid and place in your outgoing mailbox.  If for some reason you have misplaced your box then call our office and we can provide another box and/or mail it off for you.  Follow-Up: At [facility], you and your health needs are our priority.  As part of our continuing mission to provide you with exceptional heart care, we have created designated Provider Care Teams.  These Care Teams include your primary Cardiologist (physician) and Advanced Practice Providers (APPs -  Physician Assistants and Nurse Practitioners) who all work together to provide you with the care you need, when you need it.  We recommend signing up for the patient portal called "MyChart".  Sign up information is provided on this After Visit Summary.  MyChart is used to connect with patients for Virtual Visits (Telemedicine).  Patients are able to view lab/test results, encounter notes, upcoming appointments, etc.  Non-urgent messages can be sent to your provider as well.   To learn more about what you can do with MyChart, go to [email or web address].    Your next appointment:    After Zio monitor  Provider:   You may see Debbe Odea, MD or one of the following Advanced Practice Providers on your designated Care Team:   Nicolasa Ducking, NP Eula Listen, PA-C Cadence Fransico Michael, PA-C Charlsie Quest, NP

## 2023-02-11 NOTE — Progress Notes (Signed)
Cardiology Office Note:    Date:  02/11/2023   ID:  Vanessa Bryan, DOB Dec 14, 1975, MRN [id number]  PCP:  Vanessa Bryan   [facility] Providers Cardiologist:  Debbe Odea, MD     Referring MD: Vanessa Reining, PA-C   Chief Complaint  Patient presents with   New Patient (Initial Visit)    Referred for Heart palpitations.  Seen in ED on 02/03/23 for palpitations.   No cardiac history   Vanessa Bryan is a 47 y.o. female who is being seen today for the evaluation of palpitations at the request of Vanessa Bryan.   History of Present Illness:    Vanessa Bryan is a 47 y.o. female with a hx of hypertension, hyperlipidemia, anxiety presenting with palpitations.  Patient complains of palpitations ongoing over the past month or so.  Presented to the ED about a week ago due to significant palpitations.  Workup was unrevealing.  She thinks her anxiety might be contributing.  Denies any history of heart disease.  Symptoms occur several times a daily, lasting a few seconds.  Taking deep breaths sometimes help.  Denies dizziness, syncope.  Her blood pressure is usually controlled at home.  Past Medical History:  Diagnosis Date   AMA (advanced maternal age) multigravida 35+ 2016   Anxiety    Gestational diabetes    Hypertensive heart disease 06/25/2020   Lupus (HCC) 47 years old    Past Surgical History:  Procedure Laterality Date   WISDOM TOOTH EXTRACTION      Current Medications: Current Meds  Medication Sig   cetirizine (ZYRTEC) 10 MG tablet Take 10 mg by mouth daily.    levonorgestrel (MIRENA) 20 MCG/DAY IUD 1 each by Intrauterine route once.   losartan (COZAAR) 50 MG tablet Take 1 tablet (50 mg total) by mouth daily.   rosuvastatin (CRESTOR) 40 MG tablet Take 1 tablet (40 mg total) by mouth daily.     Allergies:   Topamax [topiramate]   Social History   Socioeconomic History   Marital status: Married    Spouse name: Not on file    Number of children: Not on file   Years of education: Not on file   Highest education level: Not on file  Occupational History   Not on file  Tobacco Use   Smoking status: Former    Types: Cigarettes   Smokeless tobacco: Never   Tobacco comments:    0-2 times per day  Vaping Use   Vaping status: Every Day   Substances: Nicotine, Flavoring  Substance and Sexual Activity   Alcohol use: Yes    Alcohol/week: 0.0 standard drinks of alcohol    Comment: occasionally   Drug use: No   Sexual activity: Yes    Birth control/protection: I.U.D.  Other Topics Concern   Not on file  Social History Narrative   Not on file   Social Determinants of Health   Financial Resource Strain: Not on file  Food Insecurity: Not on file  Transportation Needs: Not on file  Physical Activity: Not on file  Stress: Not on file  Social Connections: Not on file     Family History: The patient's family history includes Hypertension in her mother. There is no history of Breast cancer.  ROS:   Please see the history of present illness.     All other systems reviewed and are negative.  EKGs/Labs/Other Studies Reviewed:    The following studies were reviewed today:  EKG Interpretation Date/Time:  Thursday February 11 2023 08:50:38 EDT Ventricular Rate:  93 PR Interval:  132 QRS Duration:  80 QT Interval:  352 QTC Calculation: 437 R Axis:   49  Text Interpretation: Normal sinus rhythm Possible Left atrial enlargement Septal infarct (cited on or before 03-Feb-2023) Confirmed by Debbe Odea ([id number]) on 02/11/2023 8:53:05 AM    Recent Labs: 03/25/2022: ALT 28; TSH 2.510 02/03/2023: BUN 11; Creatinine, Ser 0.82; Hemoglobin 14.2; Platelets 288; Potassium 3.6; Sodium 137  Recent Lipid Panel    Component Value Date/Time   CHOL 199 03/25/2022 0836   TRIG 299 (H) 03/25/2022 0836   HDL 55 03/25/2022 0836   CHOLHDL 3.6 03/25/2022 0836   LDLCALC 94 03/25/2022 0836     Risk Assessment/Calculations:     HYPERTENSION CONTROL Vitals:   02/11/23 0844 02/11/23 0851  BP: (!) 142/78 (!) 198/80    The patient's blood pressure is elevated above target today.  In order to address the patient's elevated BP: Blood pressure will be monitored at home to determine if medication changes need to be made.            Physical Exam:    VS:  BP (!) 198/80 (BP Location: Right Arm, Patient Position: Sitting, Cuff Size: Normal)   Pulse 93   Ht 5\' 4"  (1.626 m)   Wt 180 lb (81.6 kg)   SpO2 98%   BMI 30.90 kg/m     Wt Readings from Last 3 Encounters:  02/11/23 180 lb (81.6 kg)  02/03/23 180 lb (81.6 kg)  01/14/23 180 lb (81.6 kg)     GEN:  Well nourished, well developed in no acute distress HEENT: Normal NECK: No JVD; No carotid bruits CARDIAC: RRR, no murmurs, rubs, gallops RESPIRATORY:  Clear to auscultation without rales, wheezing or rhonchi  ABDOMEN: Soft, non-tender, non-distended MUSCULOSKELETAL:  No edema; No deformity  SKIN: Warm and dry NEUROLOGIC:  Alert and oriented x 3 PSYCHIATRIC:  Normal affect   ASSESSMENT:    1. Palpitations   2. Primary hypertension   3. Mixed hyperlipidemia    PLAN:    In order of problems listed above:  Palpitations, anxiety could be contributing.  Place cardiac monitor to evaluate any significant arrhythmias.  If tachycardia or significant ectopy noted, will consider beta-blocker. Hypertension, BP elevated, usually controlled.  Continue losartan 50 mg daily. Hyperlipidemia, continue Crestor 40 mg daily.  Follow-up after cardiac monitor.      Medication Adjustments/Labs and Tests Ordered: Current medicines are reviewed at length with the patient today.  Concerns regarding medicines are outlined above.  Orders Placed This Encounter  Procedures   LONG TERM MONITOR (3-14 DAYS)   EKG 12-Lead   No orders of the defined types were placed in this encounter.   Patient Instructions  Medication Instructions:   Your physician recommends that  you continue on your current medications as directed. Please refer to the Current Medication list given to you today.  *If you need a refill on your cardiac medications before your next appointment, please call your pharmacy*   Lab Work:  None Ordered  If you have labs (blood work) drawn today and your tests are completely normal, you will receive your results only by: MyChart Message (if you have MyChart) OR A paper copy in the mail If you have any lab test that is abnormal or we need to change your treatment, we will call you to review the results.   Testing/Procedures:  Your physician has recommended that  you wear a Zio monitor.   This monitor is a medical device that records the heart's electrical activity. Doctors most often use these monitors to diagnose arrhythmias. Arrhythmias are problems with the speed or rhythm of the heartbeat. The monitor is a small device applied to your chest. You can wear one while you do your normal daily activities. While wearing this monitor if you have any symptoms to push the button and record what you felt. Once you have worn this monitor for the period of time provider prescribed (Usually 14 days), you will return the monitor device in the postage paid box. Once it is returned they will download the data collected and provide [place] with a report which the provider will then review and we will call you with those results. Important tips:  Avoid showering during the first 24 hours of wearing the monitor. Avoid excessive sweating to help maximize wear time. Do not submerge the device, no hot tubs, and no swimming pools. Keep any lotions or oils away from the patch. After 24 hours you may shower with the patch on. Take brief showers with your back facing the shower head.  Do not remove patch once it has been placed because that will interrupt data and decrease adhesive wear time. Push the button when you have any symptoms and write down what you were  feeling. Once you have completed wearing your monitor, remove and place into box which has postage paid and place in your outgoing mailbox.  If for some reason you have misplaced your box then call our office and we can provide another box and/or mail it off for you.  Follow-Up: At [facility], you and your health needs are our priority.  As part of our continuing mission to provide you with exceptional heart care, we have created designated Provider Care Teams.  These Care Teams include your primary Cardiologist (physician) and Advanced Practice Providers (APPs -  Physician Assistants and Nurse Practitioners) who all work together to provide you with the care you need, when you need it.  We recommend signing up for the patient portal called "MyChart".  Sign up information is provided on this After Visit Summary.  MyChart is used to connect with patients for Virtual Visits (Telemedicine).  Patients are able to view lab/test results, encounter notes, upcoming appointments, etc.  Non-urgent messages can be sent to your provider as well.   To learn more about what you can do with MyChart, go to [email or web address].    Your next appointment:    After Zio monitor  Provider:   You may see Debbe Odea, MD or one of the following Advanced Practice Providers on your designated Care Team:   Nicolasa Ducking, NP Eula Listen, PA-C Cadence Fransico Michael, PA-C Charlsie Quest, NP   Signed, Debbe Odea, MD  02/11/2023 9:30 AM    [facility]

## 2023-02-15 ENCOUNTER — Ambulatory Visit
Admission: RE | Admit: 2023-02-15 | Discharge: 2023-02-15 | Disposition: A | Discharge status: Home / Self Care | Payer: 59 | Source: Ambulatory Visit | Attending: Physician Assistant | Admitting: Physician Assistant

## 2023-02-15 DIAGNOSIS — Z1231 Encounter for screening mammogram for malignant neoplasm of breast: Secondary | ICD-10-CM | POA: Diagnosis not present

## 2023-02-22 ENCOUNTER — Encounter: Payer: Self-pay | Admitting: Cardiology

## 2023-02-26 DIAGNOSIS — R002 Palpitations: Secondary | ICD-10-CM | POA: Diagnosis not present

## 2023-03-01 ENCOUNTER — Other Ambulatory Visit: Payer: Self-pay

## 2023-03-01 DIAGNOSIS — I471 Supraventricular tachycardia, unspecified: Secondary | ICD-10-CM

## 2023-03-01 MED ORDER — METOPROLOL SUCCINATE ER 25 MG PO TB24: 25.0000 mg | ORAL_TABLET | Freq: Every day | ORAL | 3 refills | Status: DC

## 2023-03-04 ENCOUNTER — Inpatient Hospital Stay: Admission: RE | Admit: 2023-03-04 | Payer: 59 | Source: Ambulatory Visit

## 2023-03-08 ENCOUNTER — Telehealth: Payer: Self-pay | Admitting: Cardiology

## 2023-03-08 NOTE — Telephone Encounter (Signed)
Received patient symptom log from [employer]. Placed in Nurse box for Dr. Azucena Cecil

## 2023-03-08 NOTE — Telephone Encounter (Signed)
Received zio symptom log. Placed it the correct place so it can be discussed at her next OV 03/31/2023

## 2023-03-11 ENCOUNTER — Inpatient Hospital Stay: Admission: RE | Admit: 2023-03-11 | Payer: 59 | Source: Ambulatory Visit

## 2023-03-23 ENCOUNTER — Ambulatory Visit: Payer: Self-pay

## 2023-03-23 VITALS — Ht 64.0 in | Wt 178.4 lb

## 2023-03-23 DIAGNOSIS — Z0000 Encounter for general adult medical examination without abnormal findings: Secondary | ICD-10-CM

## 2023-03-23 LAB — POCT URINALYSIS DIPSTICK
Bilirubin, UA: NEGATIVE
Glucose, UA: NEGATIVE
Ketones, UA: NEGATIVE
Leukocytes, UA: NEGATIVE
Nitrite, UA: NEGATIVE
Protein, UA: NEGATIVE
Spec Grav, UA: 1.015 (ref 1.010–1.025)
Urobilinogen, UA: 0.2 E.U./dL
pH, UA: 6 (ref 5.0–8.0)

## 2023-03-23 NOTE — Progress Notes (Signed)
Pt presents today to complete labs for physical. Vanessa Bryan

## 2023-03-24 LAB — CMP12+LP+TP+TSH+6AC+CBC/D/PLT
ALT: 39 IU/L — ABNORMAL HIGH (ref 0–32)
AST: 36 IU/L (ref 0–40)
Albumin: 4.3 g/dL (ref 3.9–4.9)
Alkaline Phosphatase: 83 IU/L (ref 44–121)
BUN/Creatinine Ratio: 12 (ref 9–23)
BUN: 9 mg/dL (ref 6–24)
Basophils Absolute: 0 10*3/uL (ref 0.0–0.2)
Basos: 1 %
Bilirubin Total: 0.3 mg/dL (ref 0.0–1.2)
Calcium: 9.2 mg/dL (ref 8.7–10.2)
Chloride: 104 mmol/L (ref 96–106)
Chol/HDL Ratio: 2.3 ratio (ref 0.0–4.4)
Cholesterol, Total: 108 mg/dL (ref 100–199)
Creatinine, Ser: 0.74 mg/dL (ref 0.57–1.00)
EOS (ABSOLUTE): 0.2 10*3/uL (ref 0.0–0.4)
Eos: 3 %
Estimated CHD Risk: <0.5 times avg. (ref 0.0–1.0)
Free Thyroxine Index: 2 (ref 1.2–4.9)
GGT: 53 IU/L (ref 0–60)
Globulin, Total: 2.3 g/dL (ref 1.5–4.5)
Glucose: 78 mg/dL (ref 70–99)
HDL: 46 mg/dL (ref >39)
Hematocrit: 40.7 % (ref 34.0–46.6)
Hemoglobin: 13.4 g/dL (ref 11.1–15.9)
Immature Grans (Abs): 0 10*3/uL (ref 0.0–0.1)
Immature Granulocytes: 0 %
Iron: 98 ug/dL (ref 27–159)
LDH: 146 IU/L (ref 119–226)
LDL Chol Calc (NIH): 33 mg/dL (ref 0–99)
Lymphocytes Absolute: 1.9 10*3/uL (ref 0.7–3.1)
Lymphs: 42 %
MCH: 32.1 pg (ref 26.6–33.0)
MCHC: 32.9 g/dL (ref 31.5–35.7)
MCV: 98 fL — ABNORMAL HIGH (ref 79–97)
Monocytes Absolute: 0.3 10*3/uL (ref 0.1–0.9)
Monocytes: 7 %
Neutrophils Absolute: 2.1 10*3/uL (ref 1.4–7.0)
Neutrophils: 47 %
Phosphorus: 3.5 mg/dL (ref 3.0–4.3)
Platelets: 277 10*3/uL (ref 150–450)
Potassium: 4.7 mmol/L (ref 3.5–5.2)
RBC: 4.17 x10E6/uL (ref 3.77–5.28)
RDW: 11.9 % (ref 11.7–15.4)
Sodium: 140 mmol/L (ref 134–144)
T3 Uptake Ratio: 25 % (ref 24–39)
T4, Total: 8 ug/dL (ref 4.5–12.0)
TSH: 2.41 u[IU]/mL (ref 0.450–4.500)
Total Protein: 6.6 g/dL (ref 6.0–8.5)
Triglycerides: 177 mg/dL — ABNORMAL HIGH (ref 0–149)
Uric Acid: 5.6 mg/dL (ref 2.6–6.2)
VLDL Cholesterol Cal: 29 mg/dL (ref 5–40)
WBC: 4.4 10*3/uL (ref 3.4–10.8)
eGFR: 102 mL/min/{1.73_m2} (ref >59)

## 2023-03-31 ENCOUNTER — Ambulatory Visit: Payer: 59 | Admitting: Cardiology

## 2023-04-01 ENCOUNTER — Ambulatory Visit: Payer: Self-pay | Admitting: Physician Assistant

## 2023-04-01 ENCOUNTER — Encounter: Payer: Self-pay | Admitting: Physician Assistant

## 2023-04-01 VITALS — BP 132/93 | HR 77 | Temp 97.6°F | Resp 12 | Ht 64.0 in | Wt 178.0 lb

## 2023-04-01 DIAGNOSIS — Z0000 Encounter for general adult medical examination without abnormal findings: Secondary | ICD-10-CM

## 2023-04-01 MED ORDER — ESCITALOPRAM OXALATE 10 MG PO TABS: 10.0000 mg | ORAL_TABLET | Freq: Every day | ORAL | 0 refills | Status: DC

## 2023-04-01 NOTE — Progress Notes (Signed)
[facility] ____________________________________________   None    (approximate)  I have reviewed the triage vital signs and the nursing notes.   HISTORY  Chief Complaint No chief complaint on file.   HPI Vanessa Bryan is a 47 y.o. female patient presents for annual physical exam.  Patient is most concerned for anxiety.  Patient notes that she has had increased heart rates when stressed.  Patient has been in a evaluated by cardiologist and put on Cozaar at 50 mg.  Patient states she continues to have anxiety issues.  Patient states she is taking antianxiety medicines and her sister was 74 years old.  Patient last medication was Xanax.  Patient states she was taking this medication for episodic anxiety.  Patient's anxiety denies more constant.        Past Medical History:  Diagnosis Date   AMA (advanced maternal age) multigravida 35+ 2016   Anxiety    Gestational diabetes    Hypertensive heart disease 06/25/2020   Lupus (HCC) 47 years old    Patient Active Problem List   Diagnosis Date Noted   Hypertensive heart disease 06/25/2020   COVID-19 04/30/2020   Overweight 05/09/2019   Migraine 05/09/2019   Allergic rhinitis 05/09/2019   Fatigue 05/09/2019   Cervical radiculopathy 05/21/2016   Labor and delivery indication for care or intervention 09/24/2015   Ruptured, membranes, premature 09/24/2015   Abnormal MSAFP (maternal serum alpha-fetoprotein), elevated 05/16/2015    Past Surgical History:  Procedure Laterality Date   WISDOM TOOTH EXTRACTION      Prior to Admission medications   Medication Sig Start Date End Date Taking? Authorizing Provider  cetirizine (ZYRTEC) 10 MG tablet Take 10 mg by mouth daily.     [provider]  levonorgestrel (MIRENA) 20 MCG/DAY IUD 1 each by Intrauterine route once. 11/20/15   [provider]  losartan (COZAAR) 50 MG tablet Take 1 tablet (50 mg total) by mouth daily. 07/06/22    Joni Reining, PA-C  metoprolol succinate (TOPROL XL) 25 MG 24 hr tablet Take 1 tablet (25 mg total) by mouth daily. 03/01/23   Debbe Odea, MD  rosuvastatin (CRESTOR) 40 MG tablet Take 1 tablet (40 mg total) by mouth daily. 03/31/22   Joni Reining, PA-C    Allergies Topamax [topiramate]  Family History  Problem Relation Age of Onset   Hypertension Mother    Breast cancer Neg Hx     Social History Social History   Tobacco Use   Smoking status: Former    Types: Cigarettes   Smokeless tobacco: Never   Tobacco comments:    0-2 times per day  Vaping Use   Vaping status: Every Day   Substances: Nicotine, Flavoring  Substance Use Topics   Alcohol use: Yes    Alcohol/week: 0.0 standard drinks of alcohol    Comment: occasionally   Drug use: No    Review of Systems   Constitutional: No fever/chills Eyes: No visual changes. ENT: No sore throat. Cardiovascular: Denies chest pain. Respiratory: Denies shortness of breath. Gastrointestinal: No abdominal pain.  No nausea, no vomiting.  No diarrhea.  No constipation. Genitourinary: Negative for dysuria. Musculoskeletal: Negative for back pain. Skin: Negative for rash. Neurological: Negative for headaches, focal weakness or numbness. Psychiatric: Anxiety Endocrine: Hyperlipidemia and hypertension  ____________________________________________   PHYSICAL EXAM:  VITAL SIGNS: BP 132/93  Pulse 77  Resp 12  Temp 97.6 F (36.4 C)  Temp src Temporal  SpO2 100 %  Weight 178 lb (80.7 kg)  Height 5\' 4"  (1.626 m)   BMI 30.55 kg/m2  BSA 1.91 m2   Constitutional: Alert and oriented. Well appearing and in no acute distress. Eyes: Conjunctivae are normal. PERRL. EOMI. Head: Atraumatic. Nose: No congestion/rhinnorhea. Mouth/Throat: Mucous membranes are moist.  Oropharynx non-erythematous. Neck: No stridor.  No cervical spine tenderness to palpation. Hematological/Lymphatic/Immunilogical: No cervical  lymphadenopathy. Cardiovascular: Normal rate, regular rhythm. Grossly normal heart sounds.  Good peripheral circulation. Respiratory: Normal respiratory effort.  No retractions. Lungs CTAB. Gastrointestinal: Soft and nontender. No distention. No abdominal bruits. No CVA tenderness. Genitourinary: Deferred Musculoskeletal: No lower extremity tenderness nor edema.  No joint effusions. Neurologic:  Normal speech and language. No gross focal neurologic deficits are appreciated. No gait instability. Skin:  Skin is warm, dry and intact. No rash noted. Psychiatric: Mood and affect are normal. Speech and behavior are normal.  ____________________________________________   LABS        Component Ref Range & Units 9 d ago (03/23/23) 1 yr ago (03/25/22) 2 yr ago (03/20/21) 2 yr ago (04/11/20) 3 yr ago (04/28/19)  Color, UA yellow yellow Light Yellow yellow Yellow  Clarity, UA cloudy cloudy Clear cloudy Clear  Glucose, UA Negative Negative Negative Negative Negative Negative  Bilirubin, UA neg negatve Negative negative Negative  Ketones, UA neg negative Negative negative Negative  Spec Grav, UA 1.010 - 1.025 1.015 >=1.030 Abnormal  1.015 >=1.030 Abnormal  1.025  Blood, UA trace -+ negative Negative +- CM Negative  pH, UA 5.0 - 8.0 6.0 5.5 6.0 5.5 6.0  Protein, UA Negative Negative Negative Negative Negative Negative  Urobilinogen, UA 0.2 or 1.0 E.U./dL 0.2 0.2 0.2 0.2 0.2  Nitrite, UA neg negative Negative negative Negative  Leukocytes, UA Negative Negative Negative Negative Negative Negative  Appearance    dark   Odor              Specimen Collected: 03/23/23 09:03 Last Resulted: 03/23/23 09:03      Lab Flowsheet      Order Details      View Encounter      Lab and Collection Details      Routing      Result History    View All Conversations on this Encounter      CM=Additional comments      Result Care Coordination   Patient Communication   Add Comments   Seen Back  to Top    Other Results from 03/23/2023   Contains abnormal data CMP12+LP+TP+TSH+6AC+CBC/D/Plt Order: [id number] Status: Final result      Visible to patient: Yes (seen)      Next appt: 04/02/2023 at 08:30 AM in Cardiology ([facility] [place] [facility])      Dx: Routine adult health maintenance    0 Result Notes      1 HM Topic            Component Ref Range & Units 9 d ago (03/23/23) 1 mo ago (02/03/23) 1 mo ago (02/03/23) 1 yr ago (03/25/22) 1 yr ago (09/29/21) 2 yr ago (03/20/21) 2 yr ago (10/17/20)  Glucose 70 - 99 mg/dL 78 956 High  CM  87  74 R   Uric Acid 2.6 - 6.2 mg/dL 5.6   6.2 CM  6.6 High  CM   Comment:            Therapeutic target for gout patients: <6.0  BUN 6 - 24 mg/dL 9 11 R  10  9  Creatinine, Ser 0.57 - 1.00 mg/dL 3.08 6.57 R  8.46  9.62   eGFR >59 mL/min/1.73 102   87  103   BUN/Creatinine Ratio 9 - 23 12   12  12    Sodium 134 - 144 mmol/L 140 137 R  141  139   Potassium 3.5 - 5.2 mmol/L 4.7 3.6 R  4.6  4.8   Chloride 96 - 106 mmol/L 104 103 R  101  101   Calcium 8.7 - 10.2 mg/dL 9.2 9.1 R  95.2  9.4   Phosphorus 3.0 - 4.3 mg/dL 3.5   3.6  3.2   Total Protein 6.0 - 8.5 g/dL 6.6   7.2  6.7   Albumin 3.9 - 4.9 g/dL 4.3   5.1 High   4.5 R   Globulin, Total 1.5 - 4.5 g/dL 2.3   2.1  2.2   Bilirubin Total 0.0 - 1.2 mg/dL 0.3   0.3  0.2   Alkaline Phosphatase 44 - 121 IU/L 83   98  93   LDH 119 - 226 IU/L 146   150  166   AST 0 - 40 IU/L 36   27  32   ALT 0 - 32 IU/L 39 High    28  44 High    GGT 0 - 60 IU/L 53   78 High   60   Iron 27 - 159 ug/dL 98   841  324   Cholesterol, Total 100 - 199 mg/dL 401   027 253 664 403  Triglycerides 0 - 149 mg/dL 474 High    259 High  563 High  261 High  230 High   HDL >39 mg/dL 46   55 58 40 45  VLDL Cholesterol Cal 5 - 40 mg/dL 29   50 High  29 43 High  39  LDL Chol Calc (NIH) 0 - 99 mg/dL 33   94 24 82 95  Chol/HDL Ratio 0.0 - 4.4 ratio 2.3   3.6 CM 1.9 CM 4.1 CM 4.0 CM  Comment:                                    T. Chol/HDL Ratio                                             Men  Women                               1/2 Avg.Risk  3.4    3.3                                   Avg.Risk  5.0    4.4                                2X Avg.Risk  9.6    7.1                                3X Avg.Risk 23.4   11.0  Estimated CHD Risk 0.0 - 1.0  times avg.  < 0.5   0.6 CM  0.9 CM   Comment: The CHD Risk is based on the T. Chol/HDL ratio. Other factors affect CHD Risk such as hypertension, smoking, diabetes, severe obesity, and family history of premature CHD.  TSH 0.450 - 4.500 uIU/mL 2.410   2.510  2.450   T4, Total 4.5 - 12.0 ug/dL 8.0   8.1  6.8   T3 Uptake Ratio 24 - 39 % 25   25  24    Free Thyroxine Index 1.2 - 4.9 2.0   2.0  1.6   WBC 3.4 - 10.8 x10E3/uL 4.4  6.7 R 5.3  4.5   RBC 3.77 - 5.28 x10E6/uL 4.17  4.30 R 4.43  4.18   Hemoglobin 11.1 - 15.9 g/dL 16.1  09.6 R 04.5  40.9   Hematocrit 34.0 - 46.6 % 40.7  42.1 R 42.7  41.0   MCV 79 - 97 fL 98 High   97.9 R 96  98 High    MCH 26.6 - 33.0 pg 32.1  33.0 R 33.6 High   33.5 High    MCHC 31.5 - 35.7 g/dL 81.1  91.4 R 78.2  95.6   RDW 11.7 - 15.4 % 11.9  12.3 R 12.2  11.8   Platelets 150 - 450 x10E3/uL 277  288 R 302  344   Neutrophils Not Estab. % 47   49  41   Lymphs Not Estab. % 42   40  48   Monocytes Not Estab. % 7   6  7    Eos Not Estab. % 3   4  3    Basos Not Estab. % 1   1  1    Neutrophils Absolute 1.4 - 7.0 x10E3/uL 2.1   2.6  1.9   Lymphocytes Absolute 0.7 - 3.1 x10E3/uL 1.9   2.1  2.2   Monocytes Absolute 0.1 - 0.9 x10E3/uL 0.3   0.3  0.3   EOS (ABSOLUTE) 0.0 - 0.4 x10E3/uL 0.2   0.2  0.1   Basophils Absolute 0.0 - 0.2 x10E3/uL 0.0   0.1  0.1   Immature Granulocytes Not Estab. % 0   0  0   Immature Grans (Abs)             ____________________________________________  EKG  Sinus rhythm at 72 bpm.  Left atrial  enlargement. ____________________________________________    ____________________________________________   INITIAL IMPRESSION / ASSESSMENT AND PLAN  As part of my medical decision making, I reviewed the following data within the electronic MEDICAL RECORD NUMBER       No acute findings on physical exam, EKG, and labs.  Discussed anxiety and patient amenable to starting a trial of Paxil.  Patient will follow-up in 2 weeks.     ____________________________________________   FINAL CLINICAL IMPRESSIONES Well exam    ED Discharge Orders     None        Note:  This document was prepared using Dragon voice recognition software and may include unintentional dictation errors.

## 2023-04-01 NOTE — Progress Notes (Signed)
Pt presents today to complete physical, and pt is concerned with dealing with anxiety. Pt was seen in ER 02/11/23 for palpitations Provider stated anxiety could be contributing.

## 2023-04-02 ENCOUNTER — Ambulatory Visit: Payer: 59 | Attending: Cardiology

## 2023-04-02 DIAGNOSIS — I471 Supraventricular tachycardia, unspecified: Secondary | ICD-10-CM

## 2023-04-02 LAB — ECHOCARDIOGRAM COMPLETE
AR max vel: 2.43 cm2
AV Area VTI: 2.47 cm2
AV Area mean vel: 2.45 cm2
AV Mean grad: 4 mmHg
AV Peak grad: 7.2 mmHg
Ao pk vel: 1.34 m/s
Area-P 1/2: 3.77 cm2
Calc EF: 57.9 %
S' Lateral: 2.1 cm
Single Plane A2C EF: 58.3 %
Single Plane A4C EF: 57.6 %

## 2023-04-07 ENCOUNTER — Encounter: Payer: Self-pay | Admitting: Cardiology

## 2023-04-07 ENCOUNTER — Ambulatory Visit: Payer: 59 | Attending: Cardiology | Admitting: Cardiology

## 2023-04-07 VITALS — BP 128/74 | HR 84 | Ht 64.0 in | Wt 179.0 lb

## 2023-04-07 DIAGNOSIS — E782 Mixed hyperlipidemia: Secondary | ICD-10-CM

## 2023-04-07 DIAGNOSIS — R002 Palpitations: Secondary | ICD-10-CM

## 2023-04-07 DIAGNOSIS — I471 Supraventricular tachycardia, unspecified: Secondary | ICD-10-CM | POA: Diagnosis not present

## 2023-04-07 DIAGNOSIS — I10 Essential (primary) hypertension: Secondary | ICD-10-CM | POA: Diagnosis not present

## 2023-04-07 NOTE — Progress Notes (Signed)
Cardiology Office Note:  .   Date:  04/07/2023  ID:  [name], DOB 03/20/1976, MRN [id number] PCP: Wells Guiles  [facility] Providers Cardiologist:  Debbe Odea, MD    History of Present Illness: .   DAKARIA ESQUIVIAS is a 47 y.o. female with a past medical history of palpitations, hypertension, hyperlipidemia, anxiety, who is here today for follow-up.  Patient was last seen in clinic 02/11/2023 by Dr.Agbor-Etang.  This appointment was after recent visit to the emergency department due to significant palpitations.  In the ER workup was unrevealing.  She thinks anxiety may be contributing to the episodes.  She was placed on cardiac monitor to evaluate for any significant arrhythmias continued on her current medication regimen without any other testing ordered.  There was no significant arrhythmias noted on her monitor but she did have some nonsustained VT and paroxysmal SVT.  Was started on low-dose beta-blocker and scheduled for an echocardiogram.  She returns to clinic today stating that overall she has been doing well from a cardiac perspective.  She denies any chest pain, shortness of breath, peripheral edema, or palpitations.  She believes that her previous issues are sparked by anxiety.  Since that she has not had any further episodes.  Blood pressure has been better controlled since being started on Toprol-XL.  Denies any hospitalization or visits to the emergency department.  ROS: 10 point review of system has been reviewed and considered negative with exception of what is been listed in the HPI  Studies Reviewed: Marland Kitchen       TTE 04/02/23 1. Left ventricular ejection fraction, by estimation, is 60 to 65%. The  left ventricle has normal function. The left ventricle has no regional  wall motion abnormalities. Left ventricular diastolic parameters were  normal. The average left ventricular  global longitudinal strain is -20.0 %. The global longitudinal strain is   normal.   2. Right ventricular systolic function is normal. The right ventricular  size is normal.   3. The mitral valve is normal in structure. No evidence of mitral valve  regurgitation.   4. The aortic valve is tricuspid. Aortic valve regurgitation is not  visualized.   5. The inferior vena cava is normal in size with greater than 50%  respiratory variability, suggesting right atrial pressure of 3 mmHg.   Heart Monitor 02/26/23 Patient had a min HR of 56 bpm, max HR of 182 bpm, and avg HR of 92 bpm. Predominant underlying rhythm was Sinus Rhythm. 3 Ventricular Tachycardia runs occurred, the run with the fastest interval lasting 4 beats with a max rate of 146 bpm, the longest  lasting 6 beats with an avg rate of 101 bpm. 2 Supraventricular Tachycardia runs occurred, the run with the fastest interval lasting 4 beats with a max rate of 182 bpm, the longest lasting 6 beats with an avg rate of 118 bpm. Isolated SVEs were rare  (<1.0%), SVE Couplets were rare (<1.0%), and SVE Triplets were rare (<1.0%). Isolated VEs were rare (<1.0%, 42), VE Couplets were rare (<1.0%, 3), and VE Triplets were rare (<1.0%, 1).    Conclusion 3 nonsustained VT noted, lasting 4-6 beats. 2 paroxysmal SVT, lasting about 4-6 beats Overall no significant sustained arrhythmias.   Risk Assessment/Calculations:             Physical Exam:   VS:  BP 128/74 (BP Location: Left Arm, Patient Position: Sitting, Cuff Size: Normal)   Pulse 84   Ht 5'  4" (1.626 m)   Wt 179 lb (81.2 kg)   SpO2 98%   BMI 30.73 kg/m    Wt Readings from Last 3 Encounters:  04/07/23 179 lb (81.2 kg)  04/01/23 178 lb (80.7 kg)  03/23/23 178 lb 6.4 oz (80.9 kg)    GEN: Well nourished, well developed in no acute distress NECK: No JVD; No carotid bruits CARDIAC: RRR, no murmurs, rubs, gallops RESPIRATORY:  Clear to auscultation without rales, wheezing or rhonchi  ABDOMEN: Soft, non-tender, non-distended EXTREMITIES:  No edema; No deformity    ASSESSMENT AND PLAN: .   Palpitations with anxiety being the main contributing factor.  Previously was placed on a ZIO monitor which revealed 3 nonsustained episodes of V. tach, 2 paroxysmal episodes of SVT, but overall no significant sustained arrhythmias.  With those findings she was started on Toprol-XL 25 mg daily and is scheduled for an echocardiogram.  Echocardiogram revealed an LVEF of 60 to 65%, no regional wall motion abnormalities, and no valvular abnormalities.  Since her last visit her palpitations have improved.  She has been encouraged to continue to watch her caffeine intake as well.  Primary hypertension with blood pressure 128/74.  Blood pressures remain stable.  She is continued on losartan 50 mg daily and Toprol-XL 25 mg daily.  Encouraged to monitor blood pressure 1 to 2 hours postmedication administration.  Mixed hyperlipidemia LDL of 29 on 03/23/2023 . She remains on rosuvastatin 40 mg daily.  This continues to be managed by her PCP.       Dispo: Patient return to clinic to see MD/APP in 6 months or sooner if needed  Signed, Larz Mark, NP

## 2023-04-07 NOTE — Patient Instructions (Signed)
Medication Instructions:  No changes *If you need a refill on your cardiac medications before your next appointment, please call your pharmacy*   Lab Work: None ordered If you have labs (blood work) drawn today and your tests are completely normal, you will receive your results only by: MyChart Message (if you have MyChart) OR A paper copy in the mail If you have any lab test that is abnormal or we need to change your treatment, we will call you to review the results.   Testing/Procedures: None ordered   Follow-Up: At [facility], you and your health needs are our priority.  As part of our continuing mission to provide you with exceptional heart care, we have created designated Provider Care Teams.  These Care Teams include your primary Cardiologist (physician) and Advanced Practice Providers (APPs -  Physician Assistants and Nurse Practitioners) who all work together to provide you with the care you need, when you need it.  We recommend signing up for the patient portal called "MyChart".  Sign up information is provided on this After Visit Summary.  MyChart is used to connect with patients for Virtual Visits (Telemedicine).  Patients are able to view lab/test results, encounter notes, upcoming appointments, etc.  Non-urgent messages can be sent to your provider as well.   To learn more about what you can do with MyChart, go to [email or web address].    Your next appointment:   6 month(s)  Provider:   You may see Brian Agbor-Etang, MD or one of the following Advanced Practice Providers on your designated Care Team:   Christopher Berge, NP Ryan Dunn, PA-C Cadence Furth, PA-C Sheri Hammock, NP    

## 2023-04-15 ENCOUNTER — Ambulatory Visit: Payer: 59 | Admitting: Physician Assistant

## 2023-06-01 IMAGING — MG DIGITAL DIAGNOSTIC BILAT W/ TOMO W/ CAD
8 series · 8 of 24 positions shown · non-contrast
Comparison: Previous exam(s).

CLINICAL DATA: BI-RADS 3 follow-up of a RIGHT breast mass,
initiated 8427. Mass is sonographically occult.

EXAM:
DIGITAL DIAGNOSTIC BILATERAL MAMMOGRAM WITH TOMOSYNTHESIS AND CAD
TECHNIQUE: Bilateral digital diagnostic mammography and breast tomosynthesis
was performed. The images were evaluated with computer-aided
detection.

[L CC synth-2D]
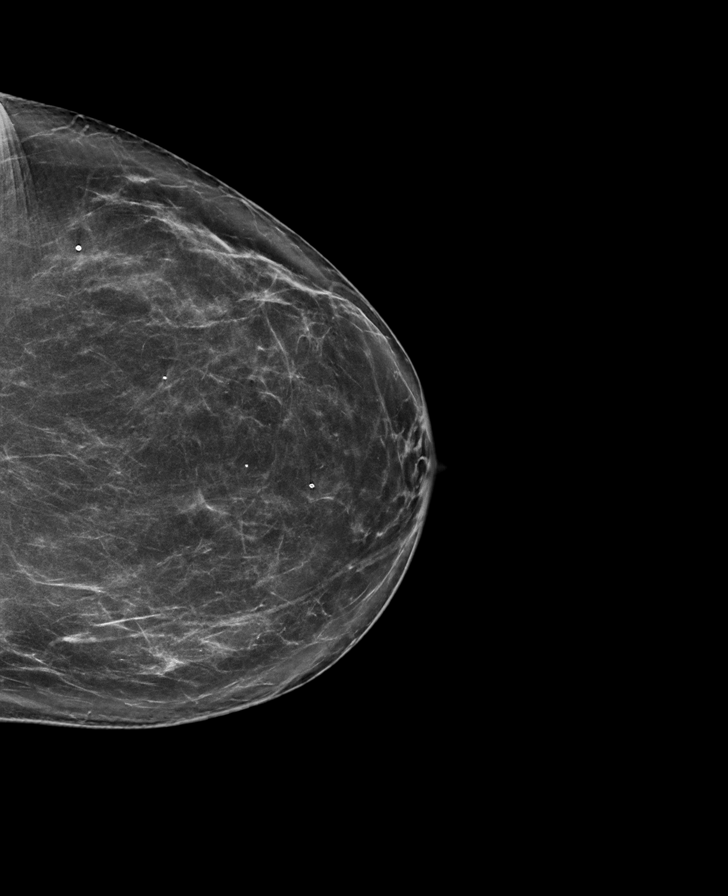

[R CC synth-2D]
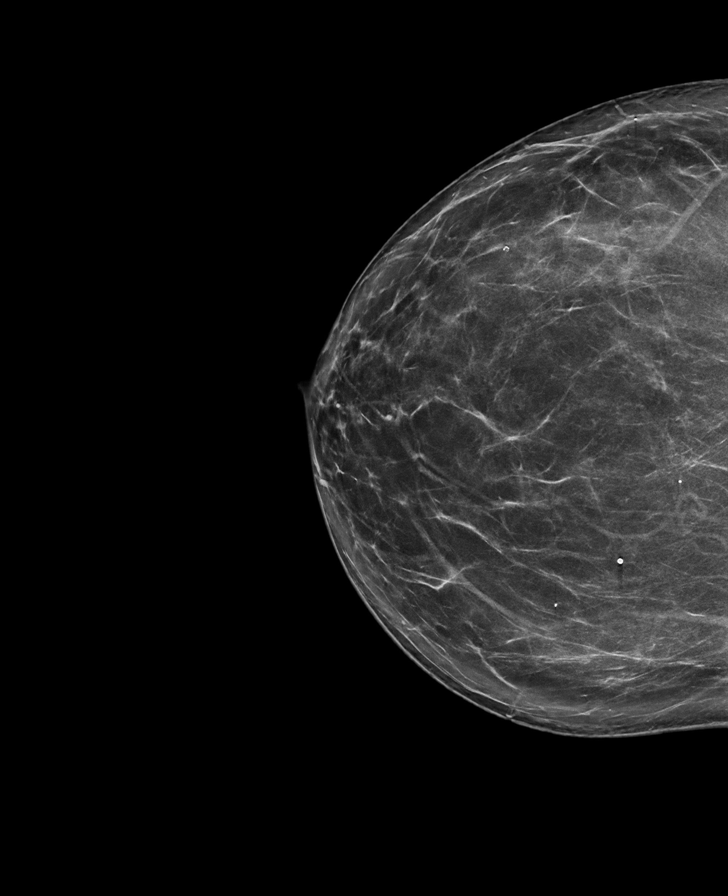

[R MLO synth-2D]
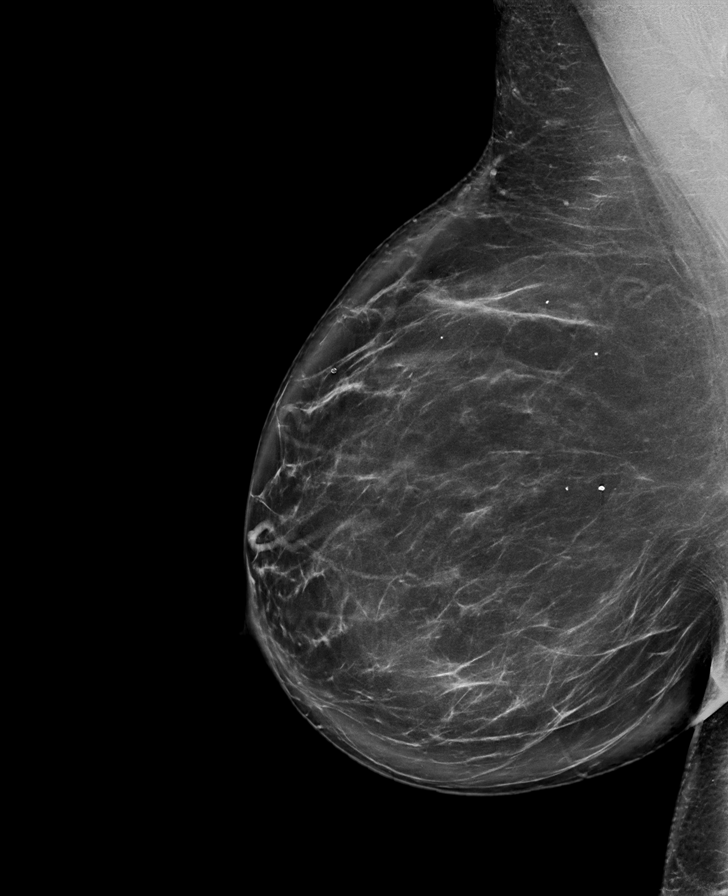

[L MLO synth-2D]
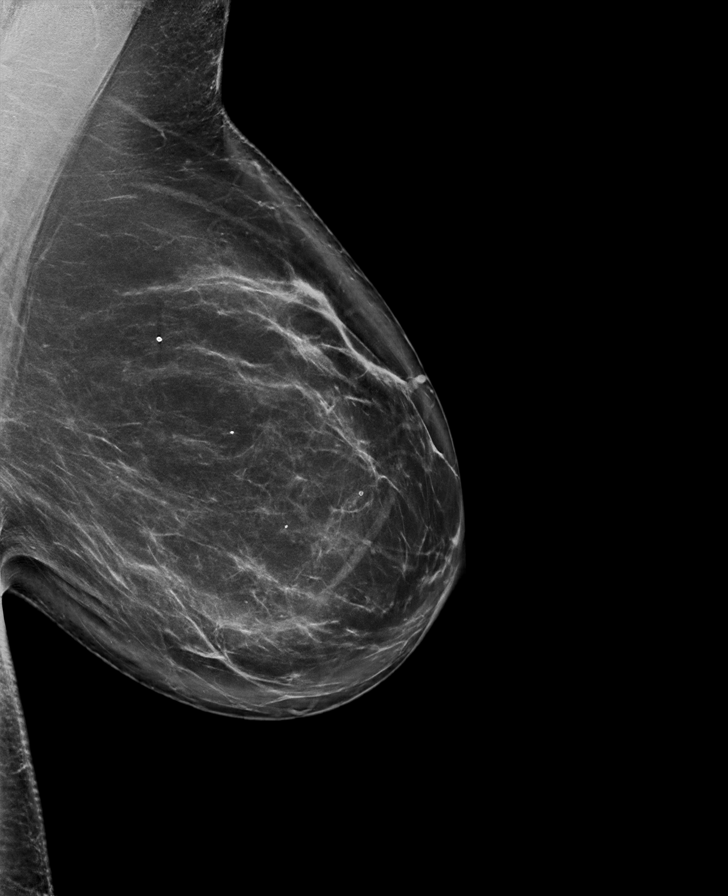

[L CC tomo · tomo slice 44/87.0]
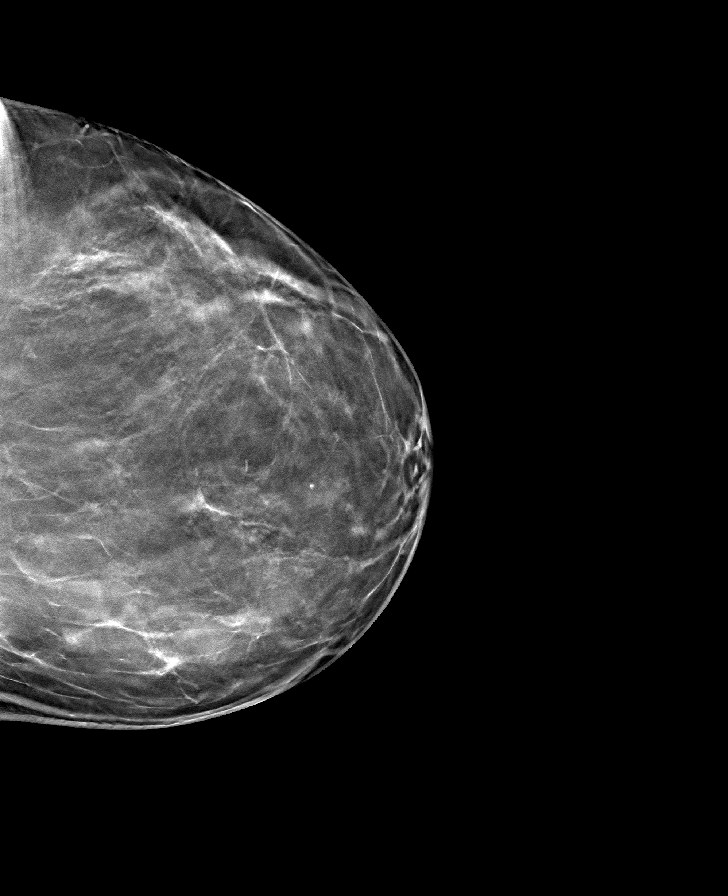

[R CC tomo · tomo slice 45/88.0]
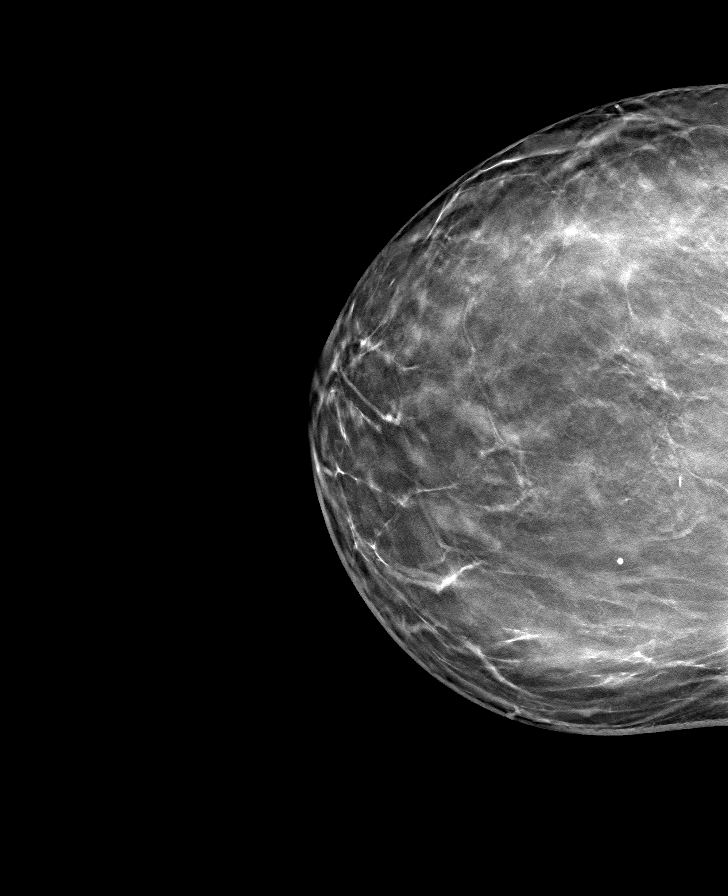

[L MLO tomo · tomo slice 51/101.0]
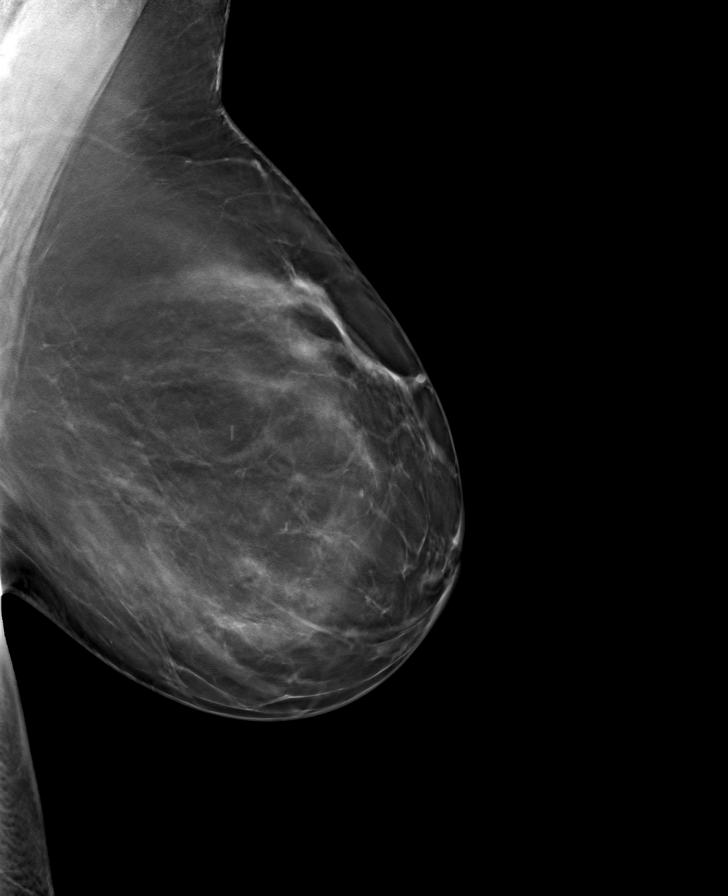

[R MLO tomo · tomo slice 51/102.0]
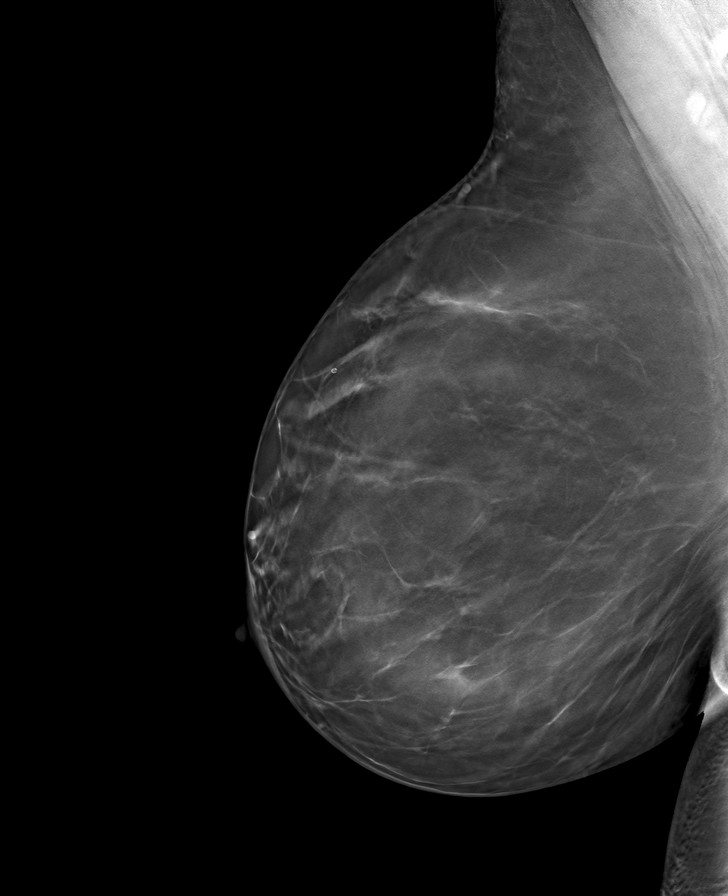

[8 of 24 positions shown; findings below may reference images not displayed]

ACR Breast Density Category b: There are scattered areas of
fibroglandular density.
FINDINGS: Diagnostic images of the RIGHT breast demonstrate resolution of
previously described oval circumscribed mass in the RIGHT central
slightly lateral breast. This is most consistent with a resolved
cyst. No suspicious mass, distortion, or microcalcifications are
identified to suggest presence of malignancy bilaterally.
IMPRESSION: No mammographic evidence of malignancy bilaterally.

RECOMMENDATION:
Screening mammogram in one year.(Code:QP-2-ULE)

I have discussed the findings and recommendations with the patient.
If applicable, a reminder letter will be sent to the patient
regarding the next appointment.

BI-RADS CATEGORY  2: Benign.

## 2023-06-03 ENCOUNTER — Other Ambulatory Visit: Payer: Self-pay

## 2023-06-03 DIAGNOSIS — E782 Mixed hyperlipidemia: Secondary | ICD-10-CM

## 2023-06-03 MED ORDER — ROSUVASTATIN CALCIUM 40 MG PO TABS: 40.0000 mg | ORAL_TABLET | Freq: Every day | ORAL | 3 refills | Status: DC

## 2023-07-06 DIAGNOSIS — K136 Irritative hyperplasia of oral mucosa: Secondary | ICD-10-CM | POA: Diagnosis not present

## 2023-09-03 ENCOUNTER — Other Ambulatory Visit: Payer: Self-pay

## 2023-09-03 DIAGNOSIS — I10 Essential (primary) hypertension: Secondary | ICD-10-CM

## 2023-09-03 MED ORDER — LOSARTAN POTASSIUM 50 MG PO TABS
50.0000 mg | ORAL_TABLET | Freq: Every day | ORAL | 3 refills | Status: DC
Start: 2023-09-03 — End: 2024-04-04

## 2023-11-30 ENCOUNTER — Other Ambulatory Visit: Payer: Self-pay

## 2023-11-30 DIAGNOSIS — B001 Herpesviral vesicular dermatitis: Secondary | ICD-10-CM

## 2023-11-30 MED ORDER — PENCICLOVIR 1 % EX CREA
1.0000 | TOPICAL_CREAM | CUTANEOUS | 0 refills | Status: DC
Start: 2023-11-30 — End: 2024-02-08

## 2024-02-07 NOTE — Progress Notes (Unsigned)
   No chief complaint on file.    History of Present Illness:  Vanessa Bryan is a 48 y.o. that had a Mirena  IUD placed 11/20/15.  Since that time, she denies dyspareunia, pelvic pain, non-menstrual bleeding, vaginal d/c, heavy bleeding.    There were no vitals taken for this visit.  Pelvic exam:  Two IUD strings {DESC; PRESENT/ABSENT:17923} seen coming from the cervical os. EGBUS, vaginal vault and cervix: within normal limits  IUD Removal Strings of IUD identified and grasped.  IUD removed without problem with ring forceps.  Pt tolerated this well.  IUD noted to be intact.  IUD Insertion Procedure Note Patient identified, informed consent performed, consent signed.   Discussed risks of irregular bleeding, cramping, infection, malpositioning or misplacement of the IUD outside the uterus which may require further procedure such as laparoscopy, risk of failure <1%. Time out was performed.  Urine pregnancy test negative.***  Speculum placed in the vagina.  Cervix visualized.  Cleaned with Betadine x 2.  Grasped anteriorly with a single tooth tenaculum.  Uterus sounded to *** cm.   IUD placed per manufacturer's recommendations.  Strings trimmed to 3 cm. Tenaculum was removed, good hemostasis noted.  Patient tolerated procedure well.   ASSESSMENT:  No diagnosis found.   No orders of the defined types were placed in this encounter.    Plan:  Patient was given post-procedure instructions.  She was advised to have backup contraception for one week.   Call if you are having increasing pain, cramps or bleeding or if you have a fever greater than 100.4 degrees F., shaking chills, nausea or vomiting. Patient was also asked to check IUD strings periodically and follow up in 4 weeks for IUD check.  No follow-ups on file.  Kobi Aller B. Dedrick Heffner, PA-C 02/07/2024 4:58 PM

## 2024-02-08 ENCOUNTER — Ambulatory Visit: Admitting: Obstetrics and Gynecology

## 2024-02-08 ENCOUNTER — Encounter: Payer: Self-pay | Admitting: Obstetrics and Gynecology

## 2024-02-08 VITALS — BP 123/77 | HR 102 | Ht 64.0 in | Wt 180.0 lb

## 2024-02-08 DIAGNOSIS — Z3202 Encounter for pregnancy test, result negative: Secondary | ICD-10-CM | POA: Diagnosis not present

## 2024-02-08 DIAGNOSIS — Z30433 Encounter for removal and reinsertion of intrauterine contraceptive device: Secondary | ICD-10-CM | POA: Diagnosis not present

## 2024-02-08 LAB — POCT URINE PREGNANCY: Preg Test, Ur: NEGATIVE

## 2024-02-08 MED ORDER — LEVONORGESTREL 20 MCG/DAY IU IUD
1.0000 | INTRAUTERINE_SYSTEM | Freq: Once | INTRAUTERINE | Status: AC
Start: 2024-02-08 — End: 2024-02-08
  Administered 2024-02-08: 1 via INTRAUTERINE

## 2024-02-08 NOTE — Patient Instructions (Signed)
 I value your feedback and you entrusting [place] with your care. If you get a  patient survey, I would appreciate you taking the time to let us know about your experience today. Thank you!  Instructions after IUD insertion  Most women experience no significant problems after insertion of an IUD, however minor cramping and spotting for a few days is common. Cramps may be treated with ibuprofen 800mg  every 8 hours or Tylenol 650 mg every 4 hours. Contact [facility] immediately if you experience any of the following symptoms during the next week: temperature >99.6 degrees, worsening pelvic pain, abdominal pain, fainting, unusually heavy vaginal bleeding, foul vaginal discharge, or if you think you have expelled the IUD. Nothing inserted in the vagina for 48 hours. You will be scheduled for a follow up visit in approximately four weeks.

## 2024-02-14 ENCOUNTER — Other Ambulatory Visit: Payer: Self-pay

## 2024-02-14 ENCOUNTER — Emergency Department
Admission: EM | Admit: 2024-02-14 | Discharge: 2024-02-14 | Disposition: A | Discharge status: Home / Self Care | Attending: Emergency Medicine | Admitting: Emergency Medicine

## 2024-02-14 DIAGNOSIS — R000 Tachycardia, unspecified: Secondary | ICD-10-CM | POA: Diagnosis not present

## 2024-02-14 HISTORY — DX: Essential (primary) hypertension: I10

## 2024-02-14 LAB — CBC WITH DIFFERENTIAL/PLATELET
Abs Immature Granulocytes: 0.02 K/uL (ref 0.00–0.07)
Basophils Absolute: 0 K/uL (ref 0.0–0.1)
Basophils Relative: 1 %
Eosinophils Absolute: 0.1 K/uL (ref 0.0–0.5)
Eosinophils Relative: 3 %
HCT: 42.1 % (ref 36.0–46.0)
Hemoglobin: 14.4 g/dL (ref 12.0–15.0)
Immature Granulocytes: 0 %
Lymphocytes Relative: 35 %
Lymphs Abs: 2 K/uL (ref 0.7–4.0)
MCH: 33.6 pg (ref 26.0–34.0)
MCHC: 34.2 g/dL (ref 30.0–36.0)
MCV: 98.1 fL (ref 80.0–100.0)
Monocytes Absolute: 0.4 K/uL (ref 0.1–1.0)
Monocytes Relative: 7 %
Neutro Abs: 3.1 K/uL (ref 1.7–7.7)
Neutrophils Relative %: 54 %
Platelets: 267 K/uL (ref 150–400)
RBC: 4.29 MIL/uL (ref 3.87–5.11)
RDW: 13 % (ref 11.5–15.5)
WBC: 5.7 K/uL (ref 4.0–10.5)
nRBC: 0 % (ref 0.0–0.2)

## 2024-02-14 LAB — BASIC METABOLIC PANEL WITH GFR
Anion gap: 12 (ref 5–15)
BUN: 14 mg/dL (ref 6–20)
CO2: 24 mmol/L (ref 22–32)
Calcium: 9.3 mg/dL (ref 8.9–10.3)
Chloride: 104 mmol/L (ref 98–111)
Creatinine, Ser: 0.9 mg/dL (ref 0.44–1.00)
GFR, Estimated: >60 mL/min (ref >60)
Glucose, Bld: 117 mg/dL — ABNORMAL HIGH (ref 70–99)
Potassium: 3.9 mmol/L (ref 3.5–5.1)
Sodium: 140 mmol/L (ref 135–145)

## 2024-02-14 NOTE — ED Provider Notes (Signed)
 [facility] Emergency Department Provider Note     Event Date/Time   First MD Initiated Contact with Patient 02/14/24 1724     (approximate)   History   Tachycardia   HPI  Vanessa Bryan is a 48 y.o. female with a past medical history of SVT followed by cardiology last year but cleared presents to the ED for evaluation of tachycardia of 160 while sitting at her desk earlier today.  Patient reports she felt lightheaded at the time but symptoms resolved during my evaluation.  Denies chest pain or shortness of breath.    Physical Exam   Triage Vital Signs: ED Triage Vitals  Encounter Vitals Group     BP 02/14/24 1411 (!) 149/112     Girls Systolic BP Percentile --      Girls Diastolic BP Percentile --      Boys Systolic BP Percentile --      Boys Diastolic BP Percentile --      Pulse Rate 02/14/24 1411 (!) 114     Resp 02/14/24 1411 20     Temp 02/14/24 1411 97.9 F (36.6 C)     Temp Source 02/14/24 1411 Oral     SpO2 02/14/24 1411 100 %     Weight 02/14/24 1410 179 lb 14.3 oz (81.6 kg)     Height 02/14/24 1410 5' 4 (1.626 m)     Head Circumference --      Peak Flow --      Pain Score 02/14/24 1409 0     Pain Loc --      Pain Education --      Exclude from Growth Chart --     Most recent vital signs: Vitals:   02/14/24 1411 02/14/24 1812  BP: (!) 149/112 132/81  Pulse: (!) 114 93  Resp: 20 18  Temp: 97.9 F (36.6 C) 98 F (36.7 C)  SpO2: 100% 100%    General Well appearing.  Awake, no distress.  HEENT NCAT CV:  Good peripheral perfusion.  RRR RESP:  Normal effort.  ABD:  No distention.   ED Results / Procedures / Treatments   Labs (all labs ordered are listed, but only abnormal results are displayed) Labs Reviewed  BASIC METABOLIC PANEL WITH GFR - Abnormal; Notable for the following components:      Result Value   Glucose, Bld 117 (*)    All other components within normal limits  CBC WITH DIFFERENTIAL/PLATELET   THYROID  PANEL WITH TSH    EKG  Sinus tachycardia   No results found.  PROCEDURES:  Critical Care performed: No  Procedures  MEDICATIONS ORDERED IN ED: Medications - No data to display   IMPRESSION / MDM / ASSESSMENT AND PLAN / ED COURSE  I reviewed the triage vital signs and the nursing notes.                               48 y.o. female presents to the emergency department for evaluation and treatment of tachycardia. See HPI for further details.   Differential diagnosis includes, but is not limited to sinus tachycardia, SVT, hyperthyroidism  Patient's presentation is most consistent with acute complicated illness / injury requiring diagnostic workup.  The patient is on the cardiac monitor to evaluate for evidence of arrhythmia and/or significant heart rate changes.  Patient is alert and oriented.  She is initially tachycardic at 114 bpm When arrived to  the ED. this has improved.  Patient is asymptomatic at my time of evaluation.  Lab work is reassuring.  Physical exam findings reveal regular heart rate and rhythm on auscultation.  Initial EKG shows sinus tachycardia.  Thyroid  panel is negative.  I have advised close follow-up with cardiology as patient has had an episode of SVT in the past and was placed on the medication that she no longer takes.  I did advise benefit of discussing with cardiologist of possible medication management if this continues.  She verbalized understanding.  Patient is in stable condition for discharge home.  ED return precautions discussed.   FINAL CLINICAL IMPRESSION(S) / ED DIAGNOSES   Final diagnoses:  Tachycardia   Rx / DC Orders   ED Discharge Orders     None        Note:  This document was prepared using Dragon voice recognition software and may include unintentional dictation errors.    Margrette, Shianne Zeiser A, PA-C 02/15/24 0930    Jossie Artist POUR, MD 02/15/24 [phone]

## 2024-02-14 NOTE — ED Provider Triage Note (Signed)
 Emergency Medicine Provider Triage Evaluation Note  Vanessa Bryan , a 48 y.o. female  was evaluated in triage.  Pt complains of tachycardia, was sitting at her desk when her HR jumped up into the 160s. Felt very light headed at the time. Was able to take some deep breaths and this helped.  She was seen by cardiology last year and was cleared.   Review of Systems  Positive:  Negative: CP, SOB  Physical Exam  There were no vitals taken for this visit. Gen:   Awake, no distress   Resp:  Normal effort  MSK:   Moves extremities without difficulty  Other:    Medical Decision Making  Medically screening exam initiated at 2:06 PM.  Appropriate orders placed.  Vanessa Bryan was informed that the remainder of the evaluation will be completed by another provider, this initial triage assessment does not replace that evaluation, and the importance of remaining in the ED until their evaluation is complete.     Cleaster Tinnie LABOR, PA-C 02/14/24 1408

## 2024-02-14 NOTE — Discharge Instructions (Addendum)
 You were evaluated in the ED for elevated heart rate.  Your lab work is reassuring.  Your EKG is reassuring.  Your symptoms resolved in the ED, however we would like for you to follow-up with cardiology for further evaluation.  Your thyroid  panel is pending.  Please check MyChart for these results. if any abnormal results you will receive a call.

## 2024-02-14 NOTE — ED Notes (Signed)
 Pt verbalized understanding of discharge instructions. Opportunity for questions provided.

## 2024-02-14 NOTE — ED Triage Notes (Signed)
 To ED for tachycardia and lightheadedness today while sitting at desk, HR was 160. Denies SOB.   Seen by cardiology and cleared last year.   Hx HTN. HE 120. Skin is dry. PA saw in triage.

## 2024-02-15 LAB — THYROID PANEL WITH TSH
Free Thyroxine Index: 1.9 (ref 1.2–4.9)
T3 Uptake Ratio: 24 % (ref 24–39)
T4, Total: 8 ug/dL (ref 4.5–12.0)
TSH: 1.98 u[IU]/mL (ref 0.450–4.500)

## 2024-02-25 ENCOUNTER — Ambulatory Visit: Attending: Medical | Admitting: Medical

## 2024-02-25 ENCOUNTER — Encounter: Payer: Self-pay | Admitting: Medical

## 2024-02-25 ENCOUNTER — Ambulatory Visit (INDEPENDENT_AMBULATORY_CARE_PROVIDER_SITE_OTHER)

## 2024-02-25 VITALS — BP 122/76 | HR 84 | Ht 64.0 in | Wt 181.4 lb

## 2024-02-25 DIAGNOSIS — I471 Supraventricular tachycardia, unspecified: Secondary | ICD-10-CM | POA: Diagnosis not present

## 2024-02-25 DIAGNOSIS — R002 Palpitations: Secondary | ICD-10-CM

## 2024-02-25 NOTE — Patient Instructions (Signed)
 Medication Instructions:  Your physician recommends that you continue on your current medications as directed. Please refer to the Current Medication list given to you today.    *If you need a refill on your cardiac medications before your next appointment, please call your pharmacy*  Lab Work: Your provider would like for you to have following labs drawn today Mg.     Testing/Procedures: Your physician has recommended that you wear a 7 day Zio monitor.   This monitor is a medical device that records the heart's electrical activity. Doctors most often use these monitors to diagnose arrhythmias. Arrhythmias are problems with the speed or rhythm of the heartbeat. The monitor is a small device applied to your chest. You can wear one while you do your normal daily activities. While wearing this monitor if you have any symptoms to push the button and record what you felt. Once you have worn this monitor for the period of time provider prescribed (Usually 14 days), you will return the monitor device in the postage paid box. Once it is returned they will download the data collected and provide us  with a report which the provider will then review and we will call you with those results. Important tips:  Avoid showering during the first 24 hours of wearing the monitor. Avoid excessive sweating to help maximize wear time. Do not submerge the device, no hot tubs, and no swimming pools. Keep any lotions or oils away from the patch. After 24 hours you may shower with the patch on. Take brief showers with your back facing the shower head.  Do not remove patch once it has been placed because that will interrupt data and decrease adhesive wear time. Push the button when you have any symptoms and write down what you were feeling. Once you have completed wearing your monitor, remove and place into box which has postage paid and place in your outgoing mailbox.  If for some reason you have misplaced your box then  call our office and we can provide another box and/or mail it off for you.     Follow-Up: At [facility], you and your health needs are our priority.  As part of our continuing mission to provide you with exceptional heart care, our providers are all part of one team.  This team includes your primary Cardiologist (physician) and Advanced Practice Providers or APPs (Physician Assistants and Nurse Practitioners) who all work together to provide you with the care you need, when you need it.  Your next appointment:   6 week(s)  Provider:   Mikey Fishman, PA-C

## 2024-02-25 NOTE — Progress Notes (Signed)
 Cardiology Office Note   Date:  02/25/2024  ID:  Vanessa Bryan, DOB 07/17/1976, MRN [id number] PCP: [name] [name]  [facility] Providers Cardiologist:  Redell Cave, MD   History of Present Illness Vanessa Bryan is a 48 y.o. female with a history of palpitations, hypertension, hyperlipidemia, anxiety who is here for ER follow-up.  Patient was seen in July 2020 for for palpitations.  ER workup was unrevealing.  Cardiac monitor showed no significant arrhythmias other than nonsustained VT and paroxysmal SVT.  She was started on low-dose beta-blocker.  Echo showed EF of 60 to 65%.  She was last seen in September 2024 reporting improved palpitations.  Patient was seen in the ER 02/14/2024 with tachycardia.  Blood pressure was 149/112, pulse rate 114 bpm.  EKG showed sinus tachycardia.  Thyroid  panel negative.  She was recommended to restart metoprolol .  Today, the patient reports when she went to the ER heart rate suddenly increased up to 167. She felt heart racing, she began sweating, felt flushed and like she was going to pass out. She is back taking metoprolol  daily. Sometimes HR goes to 80-130s. No chest pain or SOB reported. She does no formal exercise.   Studies Reviewed EKG Interpretation Date/Time:  Friday February 25 2024 09:01:59 EDT Ventricular Rate:  84 PR Interval:  146 QRS Duration:  68 QT Interval:  370 QTC Calculation: 437 R Axis:   54  Text Interpretation: Normal sinus rhythm Possible Left atrial enlargement Septal infarct (cited on or before 03-Feb-2023) When compared with ECG of 14-Feb-2024 14:08, Non-specific change in ST segment in Inferior leads T wave inversion no longer evident in Inferior leads Confirmed by Franchester, Chanteria Haggard ([id number]) on 02/25/2024 9:06:15 AM    Echo 03/2023  1. Left ventricular ejection fraction, by estimation, is 60 to 65%. The  left ventricle has normal function. The left ventricle has no regional  wall motion abnormalities.  Left ventricular diastolic parameters were  normal. The average left ventricular  global longitudinal strain is -20.0 %. The global longitudinal strain is  normal.   2. Right ventricular systolic function is normal. The right ventricular  size is normal.   3. The mitral valve is normal in structure. No evidence of mitral valve  regurgitation.   4. The aortic valve is tricuspid. Aortic valve regurgitation is not  visualized.   5. The inferior vena cava is normal in size with greater than 50%  respiratory variability, suggesting right atrial pressure of 3 mmHg.   Heart monitor 9/20204    Patch Wear Time:  6 days and 8 hours (2024-07-22T21:56:37-0400 to 2024-07-29T06:53:09-398)   Patient had a min HR of 56 bpm, max HR of 182 bpm, and avg HR of 92 bpm. Predominant underlying rhythm was Sinus Rhythm. 3 Ventricular Tachycardia runs occurred, the run with the fastest interval lasting 4 beats with a max rate of 146 bpm, the longest  lasting 6 beats with an avg rate of 101 bpm. 2 Supraventricular Tachycardia runs occurred, the run with the fastest interval lasting 4 beats with a max rate of 182 bpm, the longest lasting 6 beats with an avg rate of 118 bpm. Isolated SVEs were rare  (<1.0%), SVE Couplets were rare (<1.0%), and SVE Triplets were rare (<1.0%). Isolated VEs were rare (<1.0%, 42), VE Couplets were rare (<1.0%, 3), and VE Triplets were rare (<1.0%, 1).    Conclusion 3 nonsustained VT noted, lasting 4-6 beats. 2 paroxysmal SVT, lasting about 4-6 beats Overall no significant  sustained arrhythmias.  Physical Exam VS:  BP 122/76   Pulse 84   Ht 5' 4 (1.626 m)   Wt 181 lb 6.4 oz (82.3 kg)   SpO2 95%   BMI 31.14 kg/m        Wt Readings from Last 3 Encounters:  02/25/24 181 lb 6.4 oz (82.3 kg)  02/14/24 179 lb 14.3 oz (81.6 kg)  02/08/24 180 lb (81.6 kg)    GEN: Well nourished, well developed in no acute distress NECK: No JVD; No carotid bruits CARDIAC: RRR, no murmurs, rubs,  gallops RESPIRATORY:  Clear to auscultation without rales, wheezing or rhonchi  ABDOMEN: Soft, non-tender, non-distended EXTREMITIES:  No edema; No deformity   ASSESSMENT AND PLAN  pSVT Palpitations Patient went to the ER recently for tachycardia and palpitations.  She had sudden onset of elevated heart rate up to 160s.  She had recently stopped her metoprolol .  In the ER EKG showed sinus tachycardia with a heart rate of 114 bpm.  Labs were unremarkable.  It was recommended she restart her metoprolol .  She is taking Toprol  25 mg daily and denies further significant symptoms.  She notes occasional elevated heart rate while seated.  Heart monitor from 2024 showed normal sinus rhythm with 3 runs of nonsustained VT and 2 runs of PSVT.  Continue Toprol  25 mg daily.  I will check a magnesium level.  I will order a 1 week heart monitor.     Dispo: Follow-up in 6 weeks  Signed, Ravinder Hofland VEAR Fishman, PA-C

## 2024-02-26 LAB — MAGNESIUM: Magnesium: 1.8 mg/dL (ref 1.6–2.3)

## 2024-02-28 ENCOUNTER — Ambulatory Visit: Payer: Self-pay | Admitting: Medical

## 2024-02-29 ENCOUNTER — Ambulatory Visit: Payer: Self-pay

## 2024-02-29 VITALS — Ht 64.0 in | Wt 181.2 lb

## 2024-02-29 DIAGNOSIS — Z0000 Encounter for general adult medical examination without abnormal findings: Secondary | ICD-10-CM

## 2024-02-29 LAB — POCT URINALYSIS DIPSTICK
Bilirubin, UA: NEGATIVE
Glucose, UA: NEGATIVE
Ketones, UA: NEGATIVE
Leukocytes, UA: NEGATIVE
Nitrite, UA: NEGATIVE
Protein, UA: NEGATIVE
Spec Grav, UA: 1.01 (ref 1.010–1.025)
Urobilinogen, UA: 0.2 U/dL
pH, UA: 6 (ref 5.0–8.0)

## 2024-03-01 LAB — CMP12+LP+TP+TSH+6AC+CBC/D/PLT
ALT: 46 IU/L — ABNORMAL HIGH (ref 0–32)
AST: 48 IU/L — ABNORMAL HIGH (ref 0–40)
Albumin: 4.7 g/dL (ref 3.9–4.9)
Alkaline Phosphatase: 90 IU/L (ref 44–121)
BUN/Creatinine Ratio: 9 (ref 9–23)
BUN: 7 mg/dL (ref 6–24)
Basophils Absolute: 0 x10E3/uL (ref 0.0–0.2)
Basos: 1 %
Bilirubin Total: 0.2 mg/dL (ref 0.0–1.2)
Calcium: 9.1 mg/dL (ref 8.7–10.2)
Chloride: 101 mmol/L (ref 96–106)
Chol/HDL Ratio: 2.2 ratio (ref 0.0–4.4)
Cholesterol, Total: 121 mg/dL (ref 100–199)
Creatinine, Ser: 0.79 mg/dL (ref 0.57–1.00)
EOS (ABSOLUTE): 0.2 x10E3/uL (ref 0.0–0.4)
Eos: 4 %
Estimated CHD Risk: <0.5 times avg. (ref 0.0–1.0)
Free Thyroxine Index: 1.8 (ref 1.2–4.9)
GGT: 87 IU/L — ABNORMAL HIGH (ref 0–60)
Globulin, Total: 2.3 g/dL (ref 1.5–4.5)
Glucose: 69 mg/dL — ABNORMAL LOW (ref 70–99)
HDL: 54 mg/dL (ref >39)
Hematocrit: 41.9 % (ref 34.0–46.6)
Hemoglobin: 13.8 g/dL (ref 11.1–15.9)
Immature Grans (Abs): 0 x10E3/uL (ref 0.0–0.1)
Immature Granulocytes: 0 %
Iron: 106 ug/dL (ref 27–159)
LDH: 176 IU/L (ref 119–226)
LDL Chol Calc (NIH): 37 mg/dL (ref 0–99)
Lymphocytes Absolute: 2.1 x10E3/uL (ref 0.7–3.1)
Lymphs: 46 %
MCH: 33.3 pg — ABNORMAL HIGH (ref 26.6–33.0)
MCHC: 32.9 g/dL (ref 31.5–35.7)
MCV: 101 fL — ABNORMAL HIGH (ref 79–97)
Monocytes Absolute: 0.4 x10E3/uL (ref 0.1–0.9)
Monocytes: 8 %
Neutrophils Absolute: 1.9 x10E3/uL (ref 1.4–7.0)
Neutrophils: 41 %
Phosphorus: 3 mg/dL (ref 3.0–4.3)
Platelets: 281 x10E3/uL (ref 150–450)
Potassium: 4.3 mmol/L (ref 3.5–5.2)
RBC: 4.15 x10E6/uL (ref 3.77–5.28)
RDW: 12.6 % (ref 11.7–15.4)
Sodium: 138 mmol/L (ref 134–144)
T3 Uptake Ratio: 25 % (ref 24–39)
T4, Total: 7.2 ug/dL (ref 4.5–12.0)
TSH: 3.57 u[IU]/mL (ref 0.450–4.500)
Total Protein: 7 g/dL (ref 6.0–8.5)
Triglycerides: 190 mg/dL — ABNORMAL HIGH (ref 0–149)
Uric Acid: 6.5 mg/dL — ABNORMAL HIGH (ref 2.6–6.2)
VLDL Cholesterol Cal: 30 mg/dL (ref 5–40)
WBC: 4.6 x10E3/uL (ref 3.4–10.8)
eGFR: 94 mL/min/1.73 (ref >59)

## 2024-03-01 NOTE — Addendum Note (Signed)
 Addended by: CINDIE SMILES F on: 03/01/2024 08:19 AM   Modules accepted: Orders

## 2024-03-06 ENCOUNTER — Encounter: Payer: Self-pay | Admitting: Physician Assistant

## 2024-03-06 ENCOUNTER — Ambulatory Visit: Payer: Self-pay | Admitting: Physician Assistant

## 2024-03-06 VITALS — BP 133/87 | HR 90 | Resp 16 | Ht 64.0 in | Wt 181.0 lb

## 2024-03-06 DIAGNOSIS — Z0000 Encounter for general adult medical examination without abnormal findings: Secondary | ICD-10-CM

## 2024-03-06 NOTE — Progress Notes (Signed)
 Pt presents today to complete physical, Pt denies any issues or concerns a this time.

## 2024-03-06 NOTE — Progress Notes (Signed)
 [facility] [place] ____________________________________________   None    (approximate)  I have reviewed the triage vital signs and the nursing notes.   HISTORY  Chief Complaint No chief complaint on file.  HPI Vanessa Bryan is a 48 y.o. female patient presents for annual physical exam.  Patient voices no concerns or complaints.         Past Medical History:  Diagnosis Date   AMA (advanced maternal age) multigravida 35+ 2016   Anxiety    Hypertension    Hypertensive heart disease 06/25/2020   Lupus 48 years old    Patient Active Problem List   Diagnosis Date Noted   Hypertensive heart disease 06/25/2020   COVID-19 04/30/2020   Overweight 05/09/2019   Migraine 05/09/2019   Allergic rhinitis 05/09/2019   Fatigue 05/09/2019   Cervical radiculopathy 05/21/2016   Labor and delivery indication for care or intervention 09/24/2015   Ruptured, membranes, premature 09/24/2015   Abnormal MSAFP (maternal serum alpha-fetoprotein), elevated 05/16/2015    Past Surgical History:  Procedure Laterality Date   WISDOM TOOTH EXTRACTION      Prior to Admission medications   Medication Sig Start Date End Date Taking? Authorizing Provider  cetirizine (ZYRTEC) 10 MG tablet Take 10 mg by mouth daily.    [provider]  levonorgestrel  (MIRENA ) 20 MCG/DAY IUD 1 each by Intrauterine route once. 02/08/24   Copland, Alicia B, PA-C  losartan  (COZAAR ) 50 MG tablet Take 1 tablet (50 mg total) by mouth daily. 09/03/23   [name] POUR, PA-C  metoprolol  succinate (TOPROL -XL) 25 MG 24 hr tablet Take 25 mg by mouth daily. 02/15/24   [provider]  rosuvastatin  (CRESTOR ) 40 MG tablet Take 1 tablet (40 mg total) by mouth daily. 06/03/23   [name] POUR, PA-C    Allergies Topamax [topiramate]  Family History  Problem Relation Age of Onset   Hypertension Mother    Breast cancer Neg Hx     Social History Social History   Tobacco Use    Smoking status: Former    Types: Cigarettes   Smokeless tobacco: Never   Tobacco comments:    0-2 times per day  Vaping Use   Vaping status: Every Day   Substances: Nicotine, Flavoring  Substance Use Topics   Alcohol use: Yes    Alcohol/week: 0.0 standard drinks of alcohol    Comment: occasionally   Drug use: No    Review of Systems Constitutional: No fever/chills Eyes: No visual changes. ENT: No sore throat. Cardiovascular: Denies chest pain. Respiratory: Denies shortness of breath. Gastrointestinal: No abdominal pain.  No nausea, no vomiting.  No diarrhea.  No constipation. Genitourinary: Negative for dysuria. Musculoskeletal: Negative for back pain. Skin: Negative for rash. Neurological: Negative for headaches, focal weakness or numbness. Psychiatric: Anxiety Endocrine: Hypertension Allergic/Immunilogical: Lupus  ____________________________________________   PHYSICAL EXAM:  VITAL SIGNS: BP 133/87  Cuff Size Normal  Pulse Rate 90  Weight 181 lb (82.1 kg)  Height 5' 4 (1.626 m)  Resp 16  SpO2 100 %   BMI: 31.07 kg/m2  BSA: 1.93 m2    Constitutional: Alert and oriented. Well appearing and in no acute distress. Eyes: Conjunctivae are normal. PERRL. EOMI. Head: Atraumatic. Nose: No congestion/rhinnorhea. Mouth/Throat: Mucous membranes are moist.  Oropharynx non-erythematous. Neck: No stridor. No cervical spine tenderness to palpation. Hematological/Lymphatic/Immunilogical: No cervical lymphadenopathy. Cardiovascular: Normal rate, regular rhythm. Grossly normal heart sounds.  Good peripheral circulation. Respiratory: Normal respiratory effort.  No retractions.  Lungs CTAB. Gastrointestinal: Soft and nontender. No distention. No abdominal bruits. No CVA tenderness. Genitourinary: Deferred Musculoskeletal: No lower extremity tenderness nor edema.  No joint effusions. Neurologic:  Normal speech and language. No gross focal neurologic deficits are appreciated.  No gait instability. Skin:  Skin is warm, dry and intact. No rash noted. Psychiatric: Mood and affect are normal. Speech and behavior are normal.  ____________________________________________   LABS          Component Ref Range & Units (hover) 6 d ago 11 mo ago 1 yr ago 2 yr ago 3 yr ago 4 yr ago  Color, UA light yellow yellow yellow Light Yellow yellow Yellow  Clarity, UA clear cloudy cloudy Clear cloudy Clear  Glucose, UA Negative Negative Negative Negative Negative Negative  Bilirubin, UA neg neg negatve Negative negative Negative  Ketones, UA neg neg negative Negative negative Negative  Spec Grav, UA 1.010 1.015 >=1.030 Abnormal  1.015 >=1.030 Abnormal  1.025  Blood, UA trace -+ trace -+ negative Negative +- CM Negative  pH, UA 6.0 6.0 5.5 6.0 5.5 6.0  Protein, UA Negative Negative Negative Negative Negative Negative  Urobilinogen, UA 0.2 0.2 0.2 0.2 0.2 0.2  Nitrite, UA neg neg negative Negative negative Negative  Leukocytes, UA Negative Negative Negative Negative Negative Negative  Appearance     dark   Odor                      Component Ref Range & Units (hover) 6 d ago (02/29/24) 3 wk ago (02/14/24) 3 wk ago (02/14/24) 3 wk ago (02/14/24) 11 mo ago (03/23/23) 1 yr ago (02/03/23) 1 yr ago (02/03/23)  Glucose 69 Low   117 High  CM  78 144 High  CM   Uric Acid 6.5 High     5.6 CM    Comment:            Therapeutic target for gout patients: <6.0  BUN 7  14 R  9 11 R   Creatinine, Ser 0.79  0.90 R  0.74 0.82 R   eGFR 94    102    BUN/Creatinine Ratio 9    12    Sodium 138  140 R  140 137 R   Potassium 4.3  3.9 R  4.7 3.6 R   Chloride 101  104 R  104 103 R   Calcium  9.1  9.3 R  9.2 9.1 R   Phosphorus 3.0    3.5    Total Protein 7.0    6.6    Albumin 4.7    4.3    Globulin, Total 2.3    2.3    Bilirubin Total 0.2    0.3    Alkaline Phosphatase 90    83    LDH 176    146    AST 48 High     36    ALT 46 High     39 High     GGT 87 High     53    Iron 106    98     Cholesterol, Total 121    108    Triglycerides 190 High     177 High     HDL 54    46    VLDL Cholesterol Cal 30    29    LDL Chol Calc (NIH) 37    33    Chol/HDL Ratio 2.2    2.3 CM  Comment:                                   T. Chol/HDL Ratio                                             Men  Women                               1/2 Avg.Risk  3.4    3.3                                   Avg.Risk  5.0    4.4                                2X Avg.Risk  9.6    7.1                                3X Avg.Risk 23.4   11.0  Estimated CHD Risk  < 0.5     < 0.5 CM    Comment: The CHD Risk is based on the T. Chol/HDL ratio. Other factors affect CHD Risk such as hypertension, smoking, diabetes, severe obesity, and family history of premature CHD.  TSH 3.570 1.980   2.410    T4, Total 7.2 8.0   8.0    T3 Uptake Ratio 25 24   25     Free Thyroxine Index 1.8 1.9 CM   2.0    WBC 4.6   5.7 R 4.4  6.7 R  RBC 4.15   4.29 R 4.17  4.30 R  Hemoglobin 13.8   14.4 R 13.4  14.2 R  Hematocrit 41.9   42.1 R 40.7  42.1 R  MCV 101 High    98.1 R 98 High   97.9 R  MCH 33.3 High    33.6 R 32.1  33.0 R  MCHC 32.9   34.2 R 32.9  33.7 R  RDW 12.6   13.0 R 11.9  12.3 R  Platelets 281   267 R 277  288 R  Neutrophils 41   54 R 47    Lymphs 46    42    Monocytes 8    7    Eos 4    3    Basos 1    1    Neutrophils Absolute 1.9   3.1 R 2.1    Lymphocytes Absolute 2.1   2.0 R 1.9    Monocytes Absolute 0.4    0.3    EOS (ABSOLUTE) 0.2    0.2    Basophils Absolute 0.0   0.0 R 0.0    Immature Granulocytes 0   0 R 0    Immature Grans (Abs) 0.0    0.0                     ____________________________________________  EKG  Sinus rhythm at 72 bpm.  Left atrial enlargement. ____________________________________________    ____________________________________________   INITIAL IMPRESSION / ASSESSMENT AND PLAN  As part of my medical decision making, I reviewed the following data within the electronic  MEDICAL RECORD NUMBER       No acute findings on physical exam or EKG.  Patient triglycerides has increased since last visit.  Patient cholesterol and HDL are within normal range.      ____________________________________________   FINAL CLINICAL IMPRESSION Well exam   ED Discharge Orders     None        Note:  This document was prepared using Dragon voice recognition software and may include unintentional dictation errors.

## 2024-03-07 ENCOUNTER — Encounter: Admitting: Physician Assistant

## 2024-03-12 DIAGNOSIS — I471 Supraventricular tachycardia, unspecified: Secondary | ICD-10-CM | POA: Diagnosis not present

## 2024-03-13 DIAGNOSIS — I471 Supraventricular tachycardia, unspecified: Secondary | ICD-10-CM | POA: Diagnosis not present

## 2024-03-13 NOTE — Progress Notes (Unsigned)
 PCP:  [name] POUR, PA-C   No chief complaint on file.    HPI:      [name] is a 48 y.o. [id number] whose LMP was No LMP recorded. (Menstrual status: IUD)., presents today for her annual examination.  Her menses are absent with IUD. Occas BTB/suprapubic pains.   Sex activity: single partner, contraception - IUD. Mirena  REplaced 02/08/24.  Last Pap: 01/14/23 Results were: NILM/neg HPV DNA  Last mammogram: 02/15/23 Results were: normal--routine follow-up in 12 months. Has appt at [facility]  There is no FH of breast cancer. There is no FH of ovarian cancer. The patient does do self-breast exams.  Tobacco use: vapes daily Alcohol use: few times weekly No drug use.  Exercise: not active  She does get adequate calcium  but not Vitamin D in her diet. Labs with PCP  Patient Active Problem List   Diagnosis Date Noted   Hypertensive heart disease 06/25/2020   COVID-19 04/30/2020   Overweight 05/09/2019   Migraine 05/09/2019   Allergic rhinitis 05/09/2019   Fatigue 05/09/2019   Cervical radiculopathy 05/21/2016   Labor and delivery indication for care or intervention 09/24/2015   Ruptured, membranes, premature 09/24/2015   Abnormal MSAFP (maternal serum alpha-fetoprotein), elevated 05/16/2015    Past Surgical History:  Procedure Laterality Date   WISDOM TOOTH EXTRACTION      Family History  Problem Relation Age of Onset   Hypertension Mother    Breast cancer Neg Hx     Social History   Socioeconomic History   Marital status: Married    Spouse name: Not on file   Number of children: Not on file   Years of education: Not on file   Highest education level: Not on file  Occupational History   Not on file  Tobacco Use   Smoking status: Former    Types: Cigarettes   Smokeless tobacco: Never   Tobacco comments:    0-2 times per day  Vaping Use   Vaping status: Every Day   Substances: Nicotine, Flavoring  Substance and Sexual Activity   Alcohol use:  Yes    Alcohol/week: 0.0 standard drinks of alcohol    Comment: occasionally   Drug use: No   Sexual activity: Yes    Birth control/protection: I.U.D.    Comment: Mirena   Other Topics Concern   Not on file  Social History Narrative   Not on file   Social Drivers of Health   Financial Resource Strain: Not on file  Food Insecurity: Not on file  Transportation Needs: Not on file  Physical Activity: Not on file  Stress: Not on file  Social Connections: Not on file  Intimate Partner Violence: Not on file     Current Outpatient Medications:    cetirizine (ZYRTEC) 10 MG tablet, Take 10 mg by mouth daily., Disp: , Rfl:    levonorgestrel  (MIRENA ) 20 MCG/DAY IUD, 1 each by Intrauterine route once., Disp: , Rfl:    losartan  (COZAAR ) 50 MG tablet, Take 1 tablet (50 mg total) by mouth daily., Disp: 90 tablet, Rfl: 3   metoprolol  succinate (TOPROL -XL) 25 MG 24 hr tablet, Take 25 mg by mouth daily., Disp: , Rfl:    rosuvastatin  (CRESTOR ) 40 MG tablet, Take 1 tablet (40 mg total) by mouth daily., Disp: 90 tablet, Rfl: 3     ROS:  Review of Systems  Constitutional:  Negative for fatigue, fever and unexpected weight change.  Respiratory:  Negative for cough, shortness of breath  and wheezing.   Cardiovascular:  Negative for chest pain, palpitations and leg swelling.  Gastrointestinal:  Negative for blood in stool, constipation, diarrhea, nausea and vomiting.  Endocrine: Negative for cold intolerance, heat intolerance and polyuria.  Genitourinary:  Negative for dyspareunia, dysuria, flank pain, frequency, genital sores, hematuria, menstrual problem, pelvic pain, urgency, vaginal bleeding, vaginal discharge and vaginal pain.  Musculoskeletal:  Negative for back pain, joint swelling and myalgias.  Skin:  Negative for rash.  Neurological:  Negative for dizziness, syncope, light-headedness, numbness and headaches.  Hematological:  Negative for adenopathy.  Psychiatric/Behavioral:  Negative for  agitation, confusion, sleep disturbance and suicidal ideas. The patient is not nervous/anxious.    BREAST: No symptoms   Objective: There were no vitals taken for this visit.   Physical Exam Constitutional:      Appearance: She is well-developed.  Genitourinary:     Vulva normal.     Right Labia: No rash, tenderness or lesions.    Left Labia: No tenderness, lesions or rash.    No vaginal discharge, erythema or tenderness.      Right Adnexa: not tender and no mass present.    Left Adnexa: not tender and no mass present.    No cervical friability or polyp.     IUD strings visualized.     Uterus is not enlarged or tender.  Breasts:    Right: No mass, nipple discharge, skin change or tenderness.     Left: No mass, nipple discharge, skin change or tenderness.  Neck:     Thyroid : No thyromegaly.  Cardiovascular:     Rate and Rhythm: Normal rate and regular rhythm.     Heart sounds: Normal heart sounds. No murmur heard. Pulmonary:     Effort: Pulmonary effort is normal.     Breath sounds: Normal breath sounds.  Abdominal:     Palpations: Abdomen is soft.     Tenderness: There is no abdominal tenderness. There is no guarding or rebound.  Musculoskeletal:        General: Normal range of motion.     Cervical back: Normal range of motion.  Lymphadenopathy:     Cervical: No cervical adenopathy.  Neurological:     General: No focal deficit present.     Mental Status: She is alert and oriented to person, place, and time.     Cranial Nerves: No cranial nerve deficit.  Skin:    General: Skin is warm and dry.  Psychiatric:        Mood and Affect: Mood normal.        Behavior: Behavior normal.        Thought Content: Thought content normal.        Judgment: Judgment normal.  Vitals reviewed.    Assessment/Plan: Encounter for annual routine gynecological examination  Cervical cancer screening - Plan: Cytology - PAP  Screening for HPV (human papillomavirus) - Plan: Cytology -  PAP  Encounter for screening mammogram for malignant neoplasm of breast; pt has mammo appt  Encounter for routine checking of intrauterine contraceptive device (IUD)--IUD strings in cx os, has 8 yr indication. Will want replaced next yr.        GYN counsel breast self exam, mammography screening, adequate intake of calcium  and vitamin D, diet and exercise, vape cessation     F/U  No follow-ups on file.  Johannes Everage B. Abigaile Rossie, PA-C 03/13/2024 5:20 PM

## 2024-03-14 ENCOUNTER — Ambulatory Visit (INDEPENDENT_AMBULATORY_CARE_PROVIDER_SITE_OTHER): Admitting: Obstetrics and Gynecology

## 2024-03-14 ENCOUNTER — Encounter: Payer: Self-pay | Admitting: Obstetrics and Gynecology

## 2024-03-14 VITALS — BP 129/87 | HR 93 | Ht 64.0 in | Wt 178.0 lb

## 2024-03-14 DIAGNOSIS — Z1211 Encounter for screening for malignant neoplasm of colon: Secondary | ICD-10-CM

## 2024-03-14 DIAGNOSIS — Z01419 Encounter for gynecological examination (general) (routine) without abnormal findings: Secondary | ICD-10-CM | POA: Diagnosis not present

## 2024-03-14 DIAGNOSIS — Z30431 Encounter for routine checking of intrauterine contraceptive device: Secondary | ICD-10-CM

## 2024-03-14 DIAGNOSIS — Z1231 Encounter for screening mammogram for malignant neoplasm of breast: Secondary | ICD-10-CM

## 2024-03-14 NOTE — Patient Instructions (Signed)
 I value your feedback and you entrusting [place] with your care. If you get a [place] patient survey, I would appreciate you taking the time to let us know about your experience today. Thank you! ? ? ?

## 2024-03-19 DIAGNOSIS — Z1211 Encounter for screening for malignant neoplasm of colon: Secondary | ICD-10-CM | POA: Diagnosis not present

## 2024-03-20 ENCOUNTER — Ambulatory Visit: Payer: Self-pay | Admitting: Physician Assistant

## 2024-03-20 ENCOUNTER — Encounter: Payer: Self-pay | Admitting: Physician Assistant

## 2024-03-20 VITALS — BP 162/91 | HR 93 | Temp 98.2°F | Resp 16 | Ht 64.0 in | Wt 179.0 lb

## 2024-03-20 DIAGNOSIS — F411 Generalized anxiety disorder: Secondary | ICD-10-CM

## 2024-03-20 MED ORDER — SERTRALINE HCL 50 MG PO TABS
50.0000 mg | ORAL_TABLET | Freq: Every day | ORAL | 0 refills | Status: DC
Start: 2024-03-20 — End: 2024-04-17

## 2024-03-20 NOTE — Progress Notes (Signed)
 Wants to discuss panic/anxiety attacks - has been going on since being in hospital with tachycardia - about 3 wks ago.  States it's getting worse & wants to discuss medication options.   Used to take Sertraline  - was on it for years & weaned herself off of it because she was doing good.  States nothing has happened that has stirred up these feelings again.

## 2024-03-20 NOTE — Progress Notes (Signed)
   Subjective: Anxiety    Patient ID: Vanessa Bryan, female    DOB: April 04, 1976, 48 y.o.   MRN: [id number]  HPI Patient stated her anxiety attacks have returned.  Also an incident was after her ER visit where she was tachycardic.  Patient was seen by the ER cardiologist and medication adjustments were made.  Tachycardia has improved with anxiety as increased.  Years ago patient was on sertraline  and did well with the medication.  She did so well she decided to wean herself off medication will follow doctor's knowledge or device.  States no new life stressors except for the tachycardia event.   Review of Systems Allergic rhinitis, hypertension, and migraine headaches.    Objective:   Physical Exam BP 162/91  BP Location Left Arm  Patient Position Sitting  Cuff Size Large  Pulse Rate 93  Temp 98.2 F (36.8 C)  Temp Source Temporal  Weight 179 lb (81.2 kg)  Height 5' 4 (1.626 m)  Resp 16  SpO2 100 %  Patient appears in no distress. HEENT was unremarkable. Lungs are clear to auscultation.   Heart is regular rate and rhythm at 90 bpm.       Assessment & Plan: Anxiety  Will restart sertraline  at 50 mg daily for 1 month.  Patient will follow-up in 1 week status post taking medication for 3-day blood pressure check.  Patient also scheduled 1 month follow-up status post starting medication.

## 2024-03-26 LAB — COLOGUARD: COLOGUARD: NEGATIVE

## 2024-03-27 ENCOUNTER — Ambulatory Visit: Payer: Self-pay | Admitting: Obstetrics and Gynecology

## 2024-03-29 ENCOUNTER — Ambulatory Visit: Payer: Self-pay

## 2024-03-29 VITALS — BP 140/86 | HR 88 | Resp 14

## 2024-03-29 DIAGNOSIS — Z0130 Encounter for examination of blood pressure without abnormal findings: Secondary | ICD-10-CM

## 2024-03-29 NOTE — Progress Notes (Signed)
 Pt mentioned she started zoloft  about two weeks ago and since then she has physically had the jitters and does not like it. Per ron claudene pt is to stop today and follow up Monday or Tuesday.

## 2024-03-29 NOTE — Progress Notes (Signed)
 Pt presents today for first BP check.

## 2024-03-30 ENCOUNTER — Ambulatory Visit: Payer: Self-pay

## 2024-03-30 NOTE — Progress Notes (Signed)
 Pt presents today for 2nd BP check. Due to high heart rate and BP, pt is advised to go to ER. Pt declined because of ER not doing anything for her. Pt states she will call cardiologist. Per ronald smith, pa-c pt is allowed to take another metoprolol  50mg . That is Taken nightly. She did take one last night and is ok to go head and take another one and call cardiologist asap.

## 2024-04-03 ENCOUNTER — Ambulatory Visit: Payer: Self-pay | Admitting: Physician Assistant

## 2024-04-04 ENCOUNTER — Encounter: Payer: Self-pay | Admitting: Medical

## 2024-04-04 ENCOUNTER — Ambulatory Visit: Attending: Medical | Admitting: Medical

## 2024-04-04 VITALS — BP 150/98 | HR 86 | Ht 64.0 in | Wt 178.2 lb

## 2024-04-04 DIAGNOSIS — I10 Essential (primary) hypertension: Secondary | ICD-10-CM | POA: Diagnosis not present

## 2024-04-04 DIAGNOSIS — I471 Supraventricular tachycardia, unspecified: Secondary | ICD-10-CM

## 2024-04-04 DIAGNOSIS — R002 Palpitations: Secondary | ICD-10-CM

## 2024-04-04 MED ORDER — DILTIAZEM HCL ER COATED BEADS 120 MG PO CP24: 120.0000 mg | ORAL_CAPSULE | Freq: Every day | ORAL | 3 refills | Status: AC

## 2024-04-04 MED ORDER — METOPROLOL SUCCINATE ER 50 MG PO TB24: 50.0000 mg | ORAL_TABLET | Freq: Every day | ORAL | 3 refills | Status: AC

## 2024-04-04 MED ORDER — DILTIAZEM HCL 30 MG PO TABS: 30.0000 mg | ORAL_TABLET | Freq: Three times a day (TID) | ORAL | 3 refills | Status: AC | PRN

## 2024-04-04 NOTE — Progress Notes (Signed)
 Cardiology Office Note   Date:  04/04/2024  ID:  Vanessa Bryan, DOB 08-03-1975, MRN [id number] PCP: [name] [name]  [facility] Providers Cardiologist:  Redell Cave, MD   History of Present Illness Vanessa Bryan is a 48 y.o. female with a history of palpitations, hypertension, hyperlipidemia, anxiety who is here for follow-up.   Patient was seen in July 2020 for for palpitations.  ER workup was unrevealing.  Cardiac monitor showed no significant arrhythmias other than nonsustained VT and paroxysmal SVT.  She was started on low-dose beta-blocker.  Echo showed EF of 60 to 65%.   She was last seen in September 2024 reporting improved palpitations.   Patient was seen in the ER 02/14/2024 with tachycardia.  Blood pressure was 149/112, pulse rate 114 bpm.  EKG showed sinus tachycardia.  Thyroid  panel negative.  She was recommended to restart metoprolol .   The patient was seen in the office 02/25/24 reporting occasional elevated heart rates.  Heart monitor showed normal sinus rhythm with an average heart rate of 90, range 53-1 57.  Rare ectopy, no A-fib or a flutter.  No sustained arrhythmias.  Today, the patient reports persistent elevated heart rates and elevated blood pressures. Heart rate was around 100-110bpm. BP 179/112.  She is unsure it's anxiety. She only worse the heart monitor for 4 days. She is also unsure if it's hormonal. No chest pain or SOB.   Studies Reviewed EKG Interpretation Date/Time:  Tuesday April 04 2024 08:10:49 EDT Ventricular Rate:  86 PR Interval:  138 QRS Duration:  64 QT Interval:  374 QTC Calculation: 447 R Axis:   48  Text Interpretation: Normal sinus rhythm Possible Left atrial enlargement Septal infarct (cited on or before 03-Feb-2023) When compared with ECG of 25-Feb-2024 09:01, No significant change was found Confirmed by Franchester, Derriana Oser ([id number]) on 04/04/2024 8:16:30 AM    Heart monitor 02/2024 Patch Wear Time:  4 days and 23  hours (2025-08-06T22:15:17-0400 to 2025-08-11T21:19:13-0400)   Patient had a min HR of 53 bpm, max HR of 157 bpm, and avg HR of 90 bpm. Predominant underlying rhythm was Sinus Rhythm. Slight P wave morphology changes were noted. Isolated SVEs were rare (<1.0%), SVE Couplets were rare (<1.0%), and SVE Triplets were  rare (<1.0%). Isolated VEs were rare (<1.0%, 33), VE Triplets were rare (<1.0%, 1), and no VE Couplets were present.   Conclusion Average heart rate 90, range 53-157. No atrial fibrillation or atrial flutter. No sustained arrhythmias.   Echo 03/2023  1. Left ventricular ejection fraction, by estimation, is 60 to 65%. The  left ventricle has normal function. The left ventricle has no regional  wall motion abnormalities. Left ventricular diastolic parameters were  normal. The average left ventricular  global longitudinal strain is -20.0 %. The global longitudinal strain is  normal.   2. Right ventricular systolic function is normal. The right ventricular  size is normal.   3. The mitral valve is normal in structure. No evidence of mitral valve  regurgitation.   4. The aortic valve is tricuspid. Aortic valve regurgitation is not  visualized.   5. The inferior vena cava is normal in size with greater than 50%  respiratory variability, suggesting right atrial pressure of 3 mmHg.    Heart monitor 9/20204    Patch Wear Time:  6 days and 8 hours (2024-07-22T21:56:37-0400 to 2024-07-29T06:53:09-398)   Patient had a min HR of 56 bpm, max HR of 182 bpm, and avg HR of 92 bpm. Predominant  underlying rhythm was Sinus Rhythm. 3 Ventricular Tachycardia runs occurred, the run with the fastest interval lasting 4 beats with a max rate of 146 bpm, the longest  lasting 6 beats with an avg rate of 101 bpm. 2 Supraventricular Tachycardia runs occurred, the run with the fastest interval lasting 4 beats with a max rate of 182 bpm, the longest lasting 6 beats with an avg rate of 118 bpm. Isolated SVEs  were rare  (<1.0%), SVE Couplets were rare (<1.0%), and SVE Triplets were rare (<1.0%). Isolated VEs were rare (<1.0%, 42), VE Couplets were rare (<1.0%, 3), and VE Triplets were rare (<1.0%, 1).    Conclusion 3 nonsustained VT noted, lasting 4-6 beats. 2 paroxysmal SVT, lasting about 4-6 beats Overall no significant sustained arrhythmias.      Physical Exam VS:  BP (!) 150/98   Pulse 86   Ht 5' 4 (1.626 m)   Wt 178 lb 3.2 oz (80.8 kg)   SpO2 99%   BMI 30.59 kg/m        Wt Readings from Last 3 Encounters:  04/04/24 178 lb 3.2 oz (80.8 kg)  03/20/24 179 lb (81.2 kg)  03/14/24 178 lb (80.7 kg)    GEN: Well nourished, well developed in no acute distress NECK: No JVD; No carotid bruits CARDIAC: RRR, no murmurs, rubs, gallops RESPIRATORY:  Clear to auscultation without rales, wheezing or rhonchi  ABDOMEN: Soft, non-tender, non-distended EXTREMITIES:  No edema; No deformity   ASSESSMENT AND PLAN  Palpitations and elevated BP pSVT Heart monitor was unremarkable, however it was only worn for 4 days. She reports daily episodes of elevated heart rate and blood pressure.  She reports heart rates 100-160 at rest.  Suspect anxiety may be contributing.  EKG today shows heart rate of 86.  Blood pressure 150/98.  Unsure if hormonal issues are also contributing.  I will stop losartan  and start diltiazem  120 mg daily.  We we will continue with the higher dose of Toprol  25 mg daily.  I recommend she take them both in the morning.  I will also give diltiazem  30 mg short acting to take as needed for heart rate over 150.  We will see her back in 1 month.       Dispo: 1 month follow-up  Signed, Jorge Retz VEAR Fishman, PA-C

## 2024-04-04 NOTE — Patient Instructions (Signed)
 Medication Instructions:  Your physician recommends the following medication changes.  STOP TAKING: Losartan   START TAKING: Diltiazem  (extended release) 120 mg by mouth daily Diltiazem  30 mg by mouth 3 times daily as needed for heart rate greater than 150 bmp   *If you need a refill on your cardiac medications before your next appointment, please call your pharmacy*  Lab Work: No labs ordered today    Testing/Procedures: No test ordered today   Follow-Up: At [facility], you and your health needs are our priority.  As part of our continuing mission to provide you with exceptional heart care, our providers are all part of one team.  This team includes your primary Cardiologist (physician) and Advanced Practice Providers or APPs (Physician Assistants and Nurse Practitioners) who all work together to provide you with the care you need, when you need it.  Your next appointment:   1 month(s)  Provider:   Redell Cave, MD or Cadence Franchester, PA-C

## 2024-04-10 ENCOUNTER — Telehealth: Payer: Self-pay

## 2024-04-10 ENCOUNTER — Ambulatory Visit: Admitting: Medical

## 2024-04-10 DIAGNOSIS — F419 Anxiety disorder, unspecified: Secondary | ICD-10-CM

## 2024-04-10 NOTE — Telephone Encounter (Signed)
 Patient stated after IUD replacement she has been diagnosed with tachycardia, issues with blood pressure, has had some medications changed and was advised by cardiologist to check with GYN if this can be hormonal?

## 2024-04-11 ENCOUNTER — Ambulatory Visit: Admitting: Obstetrics and Gynecology

## 2024-04-11 MED ORDER — BUSPIRONE HCL 7.5 MG PO TABS: 7.5000 mg | ORAL_TABLET | Freq: Two times a day (BID) | ORAL | 0 refills | Status: DC

## 2024-04-11 NOTE — Telephone Encounter (Addendum)
 Spoke with pt. Pls call her to do GAD/PHQ. Can add it to my telephone note.  Front Desk: pls call pt to scheudle 4 wk f/u with me for anxiety. And cancel appt with me today. She is aware.  She is expecting to hear from both of you. Thx.

## 2024-04-11 NOTE — Telephone Encounter (Signed)
 Contacted the patient for scheduling. The patient is scheduled for 10/14 at 1:15 pm with ABC

## 2024-04-11 NOTE — Telephone Encounter (Signed)
 Spoke with pt. Has had increased anxiety since mirena  replaced 7/25. Anxiety is causing tachycardia, heart palpitations, panic attacks, and increased BP. Hx of anxiety, no depression, off and on through the years. PCP started sertraline  a few wks ago but made pt jittery so stopped it. Did sertraline  in past without SE. Is getting a little exercise, drinks 1 cup coffee daily but no other caffeine; still vaping, not taking Vit D.  Discussed increased prog dose 1st yr of IUD can cause anxiety. No hormone testing needed; normal TSH 7/25. Discussed IUD removal for paragard vs other BC. Pt doesn't want periods again. Would rather do short term anxiety med. Rx buspar  eRxd, RTO in 4 wks for f/u. CMA to call pt for GAD/PHQ today, will cancel appt today.   Meds ordered this encounter  Medications   busPIRone  (BUSPAR ) 7.5 MG tablet    Sig: Take 1 tablet (7.5 mg total) by mouth 2 (two) times daily.    Dispense:  60 tablet    Refill:  0    Supervising Provider:   LEIGH SOBER [[id number]]

## 2024-04-17 ENCOUNTER — Other Ambulatory Visit: Payer: Self-pay

## 2024-04-17 DIAGNOSIS — F411 Generalized anxiety disorder: Secondary | ICD-10-CM

## 2024-04-17 MED ORDER — SERTRALINE HCL 50 MG PO TABS: 50.0000 mg | ORAL_TABLET | Freq: Every day | ORAL | 3 refills | Status: DC

## 2024-04-17 NOTE — Progress Notes (Signed)
 Pt stated that provider told her due to side effects to discontinue.

## 2024-04-20 ENCOUNTER — Ambulatory Visit: Payer: Self-pay | Admitting: Physician Assistant

## 2024-04-26 ENCOUNTER — Encounter: Payer: Self-pay | Admitting: Obstetrics and Gynecology

## 2024-05-01 ENCOUNTER — Ambulatory Visit: Payer: Self-pay | Admitting: Physician Assistant

## 2024-05-01 ENCOUNTER — Telehealth: Payer: Self-pay | Admitting: Obstetrics and Gynecology

## 2024-05-01 ENCOUNTER — Encounter: Payer: Self-pay | Admitting: Physician Assistant

## 2024-05-01 VITALS — BP 137/86 | HR 98 | Temp 98.2°F | Resp 14

## 2024-05-01 DIAGNOSIS — F411 Generalized anxiety disorder: Secondary | ICD-10-CM

## 2024-05-01 MED ORDER — BUSPIRONE HCL 15 MG PO TABS: 15.0000 mg | ORAL_TABLET | Freq: Two times a day (BID) | ORAL | 0 refills | Status: DC

## 2024-05-01 NOTE — Progress Notes (Signed)
 Reports wanting to discuss if change of meds is needed.  Has been feeling continued and more anxiety since in Buspar  and increased from BID to TID last week per her GYN.  Stated this all started she feels with Mirena  changed around Aug/Sept.  Stated she has different meds over the years for anxiety.

## 2024-05-01 NOTE — Progress Notes (Signed)
   Subjective: Anxiety    Patient ID: Vanessa Bryan, female    DOB: 01-01-1976, 48 y.o.   MRN: [id number]  HPI Patient was started on BuSpar  for anxiety on 04/10/2026.  Prescription was initiated status post cardiac evaluation that suggested underlying anxiety.  Patient has admit to anxiety secondary to work load.  Patient was started on 7.5 mg twice daily.  Patient states [place] through the afternoon her anxiety returns patient believes the medicine is wearing off.  Patient contacted her prescribing OB/GYN Dr. Who suggested to take 7.5 mg 3 times a day.  Patient states little improvement.  Review of Systems Anxiety, fatigue, hypertension, and obesity.    Objective:   Physical Exam BP 137/86  Pulse Rate 98  Temp 98.2 F (36.8 C)  Resp 14  SpO2 97 %  Appears in no acute distress. HEENT is unremarkable. Neck is supple for lymphadenopathy or bruits. Lungs are clear to auscultation. Heart is regular rate and rhythm.       Assessment & Plan: Anxiety   Increase BuSpar  to 15 mg twice daily.  Follow-up in 2 weeks.

## 2024-05-01 NOTE — Telephone Encounter (Signed)
 Patient call a nd cancelled her appointment with you, but wanted me to let you know she followed up with her PCP.  He/ She (PCP) made adjustments to her medication.  She just wanted to let you know.

## 2024-05-01 NOTE — Telephone Encounter (Signed)
 Ok

## 2024-05-08 ENCOUNTER — Encounter: Payer: Self-pay | Admitting: Physician Assistant

## 2024-05-09 ENCOUNTER — Encounter: Payer: Self-pay | Admitting: Medical

## 2024-05-09 ENCOUNTER — Ambulatory Visit: Admitting: Obstetrics and Gynecology

## 2024-05-09 ENCOUNTER — Ambulatory Visit: Attending: Medical | Admitting: Medical

## 2024-05-09 VITALS — BP 120/80 | HR 80 | Ht 64.0 in | Wt 182.4 lb

## 2024-05-09 DIAGNOSIS — R002 Palpitations: Secondary | ICD-10-CM

## 2024-05-09 DIAGNOSIS — I10 Essential (primary) hypertension: Secondary | ICD-10-CM

## 2024-05-09 DIAGNOSIS — I471 Supraventricular tachycardia, unspecified: Secondary | ICD-10-CM | POA: Diagnosis not present

## 2024-05-09 NOTE — Patient Instructions (Signed)
 Medication Instructions:  Your physician recommends that you continue on your current medications as directed. Please refer to the Current Medication list given to you today.    *If you need a refill on your cardiac medications before your next appointment, please call your pharmacy*  Lab Work: No labs ordered today    Testing/Procedures: No test ordered today   Follow-Up: At [facility], you and your health needs are our priority.  As part of our continuing mission to provide you with exceptional heart care, our providers are all part of one team.  This team includes your primary Cardiologist (physician) and Advanced Practice Providers or APPs (Physician Assistants and Nurse Practitioners) who all work together to provide you with the care you need, when you need it.  Your next appointment:   1 year(s)  Provider:   Redell Cave, MD or Cadence Franchester, PA-C

## 2024-05-09 NOTE — Progress Notes (Signed)
 Cardiology Office Note   Date:  05/09/2024  ID:  EVALISSE Bryan, DOB 09-16-1975, MRN [id number] PCP: [name] Vanessa Bryan  [facility] Providers Cardiologist:  Redell Cave, MD   History of Present Illness Vanessa Bryan is a 48 y.o. female with a history of palpitations, hypertension, hyperlipidemia, and anxiety who is here for follow-up of palpitations.   Patient was seen in July 2020 for for palpitations.  ER workup was unrevealing.  Cardiac monitor showed no significant arrhythmias other than nonsustained VT and paroxysmal SVT.  She was started on low-dose beta-blocker.  Echo showed EF of 60 to 65%.   Patient was seen in the ER 02/14/2024 with tachycardia.  Blood pressure was 149/112, pulse rate 114 bpm.  EKG showed sinus tachycardia.  Thyroid  panel negative.  She was recommended to restart metoprolol .   The patient was seen in the office 02/25/24 reporting occasional elevated heart rates.  Heart monitor showed normal sinus rhythm with an average heart rate of 90, range 53-1 57, rare ectopy, no A-fib or a flutter.  No sustained arrhythmias.  The patient was last seen 03/2024 with persistent elevated HR and elevated blood pressures. Losartan  was stopped and diltiazem  was restarted.   Today, the patient is overall doing much better.  Blood pressure and heart rate have improved.  She feels most of her symptoms were from anxiety.  She is now on higher dose of BuSpar  and feels this has greatly helped her palpitations.  She denies any chest pain, shortness of breath, lower leg edema, heart racing, lightheadedness, dizziness.  EKG shows normal sinus rhythm with a heart rate of 80 bpm.  Studies Reviewed EKG Interpretation Date/Time:  Tuesday May 09 2024 09:07:54 EDT Ventricular Rate:  80 PR Interval:  150 QRS Duration:  66 QT Interval:  384 QTC Calculation: 442 R Axis:   51  Text Interpretation: Normal sinus rhythm Septal infarct (cited on or before 03-Feb-2023) When  compared with ECG of 04-Apr-2024 08:10, No significant change was found Confirmed by Franchester, Zaliah Wissner ([id number]) on 05/09/2024 9:16:05 AM    Heart monitor 02/2024 Patch Wear Time:  4 days and 23 hours (2025-08-06T22:15:17-0400 to 2025-08-11T21:19:13-0400)   Patient had a min HR of 53 bpm, max HR of 157 bpm, and avg HR of 90 bpm. Predominant underlying rhythm was Sinus Rhythm. Slight P wave morphology changes were noted. Isolated SVEs were rare (<1.0%), SVE Couplets were rare (<1.0%), and SVE Triplets were  rare (<1.0%). Isolated VEs were rare (<1.0%, 33), VE Triplets were rare (<1.0%, 1), and no VE Couplets were present.   Conclusion Average heart rate 90, range 53-157. No atrial fibrillation or atrial flutter. No sustained arrhythmias.   Echo 03/2023  1. Left ventricular ejection fraction, by estimation, is 60 to 65%. The  left ventricle has normal function. The left ventricle has no regional  wall motion abnormalities. Left ventricular diastolic parameters were  normal. The average left ventricular  global longitudinal strain is -20.0 %. The global longitudinal strain is  normal.   2. Right ventricular systolic function is normal. The right ventricular  size is normal.   3. The mitral valve is normal in structure. No evidence of mitral valve  regurgitation.   4. The aortic valve is tricuspid. Aortic valve regurgitation is not  visualized.   5. The inferior vena cava is normal in size with greater than 50%  respiratory variability, suggesting right atrial pressure of 3 mmHg.    Heart monitor 9/20204  Patch Wear Time:  6 days and 8 hours (2024-07-22T21:56:37-0400 to 2024-07-29T06:53:09-398)   Patient had a min HR of 56 bpm, max HR of 182 bpm, and avg HR of 92 bpm. Predominant underlying rhythm was Sinus Rhythm. 3 Ventricular Tachycardia runs occurred, the run with the fastest interval lasting 4 beats with a max rate of 146 bpm, the longest  lasting 6 beats with an avg rate of 101 bpm. 2  Supraventricular Tachycardia runs occurred, the run with the fastest interval lasting 4 beats with a max rate of 182 bpm, the longest lasting 6 beats with an avg rate of 118 bpm. Isolated SVEs were rare  (<1.0%), SVE Couplets were rare (<1.0%), and SVE Triplets were rare (<1.0%). Isolated VEs were rare (<1.0%, 42), VE Couplets were rare (<1.0%, 3), and VE Triplets were rare (<1.0%, 1).    Conclusion 3 nonsustained VT noted, lasting 4-6 beats. 2 paroxysmal SVT, lasting about 4-6 beats Overall no significant sustained arrhythmias.       Physical Exam VS:  BP 120/80 (BP Location: Left Arm, Patient Position: Sitting, Cuff Size: Normal)   Pulse 80   Ht 5' 4 (1.626 m)   Wt 182 lb 6.4 oz (82.7 kg)   SpO2 100%   BMI 31.31 kg/m        Wt Readings from Last 3 Encounters:  05/09/24 182 lb 6.4 oz (82.7 kg)  04/04/24 178 lb 3.2 oz (80.8 kg)  03/20/24 179 lb (81.2 kg)    GEN: Well nourished, well developed in no acute distress NECK: No JVD; No carotid bruits CARDIAC: RRR, no murmurs, rubs, gallops RESPIRATORY:  Clear to auscultation without rales, wheezing or rhonchi  ABDOMEN: Soft, non-tender, non-distended EXTREMITIES:  No edema; No deformity   ASSESSMENT AND PLAN  Palpitations pSVT Patient was having episodes of elevated heart rate and elevated blood pressure with the only difference being change out of the Mirena .  It was possible that anxiety and hormonal changes were contributing-she plans on getting them Mirena  out.  Prior heart monitor was unremarkable, however she only wore for 4 days.  Patient was started on diltiazem  and has been doing much better.  Her anxiety medications have also been optimized, and this has greatly improved symptoms as well.  EKG shows normal sinus rhythm with a heart rate of 80 bpm.  Continue diltiazem  120 mg daily and Toprol  50 mg daily.  She has Cardizem  30 mg to take as needed for elevated heart rate.  Patient is exercising without any issues.  HTN BP is  normal. Continue diltiazem  120 mg daily and Toprol  50 mg daily.       Dispo: Follow-up in 1 year  Signed, Cyle Kenyon VEAR Fishman, PA-C

## 2024-05-12 ENCOUNTER — Other Ambulatory Visit: Payer: Self-pay | Admitting: Cardiology

## 2024-05-16 ENCOUNTER — Encounter: Payer: Self-pay | Admitting: Physician Assistant

## 2024-05-16 ENCOUNTER — Ambulatory Visit: Payer: Self-pay | Admitting: Physician Assistant

## 2024-05-16 VITALS — BP 140/80 | HR 85 | Temp 97.3°F | Resp 14

## 2024-05-16 DIAGNOSIS — F411 Generalized anxiety disorder: Secondary | ICD-10-CM

## 2024-05-16 MED ORDER — ESCITALOPRAM OXALATE 10 MG PO TABS: 10.0000 mg | ORAL_TABLET | Freq: Every day | ORAL | 0 refills | Status: DC

## 2024-05-16 NOTE — Progress Notes (Signed)
   Subjective: Anxiety    Patient ID: Vanessa Bryan, female    DOB: August 22, 1975, 49 y.o.   MRN: [id number]  HPI Patient follow-up for anxiety status post increase of BuSpar .  Patient at max dose and states that she is still having breakthrough anxiety in the afternoons.  Patient wished to discuss alternative medication for anxiety.   Review of Systems Anxiety, hyperlipidemia, and hypertension.    Objective:   Physical Exam  05/16/2024   9:42 AM    BP 140/80  Pulse Rate 85  Temp 97.3 F (36.3 C)  Resp 14  SpO2 98 %         Assessment & Plan: Anxiety  Patient advised to discontinue BuSpar , start Lexapro  at 10 mg daily and follow-up in 2 weeks.

## 2024-05-16 NOTE — Progress Notes (Signed)
 Stated continues anxiety and Buspar  was increased to TID.  Stated she increased it herself taking 15mg  TID instead of 15mg  BID with the 7.5mg  for the middle day dose for total of taking TID.  Stated concentrating better with increasing the mg dose.

## 2024-05-30 ENCOUNTER — Ambulatory Visit: Payer: Self-pay | Admitting: Physician Assistant

## 2024-05-30 ENCOUNTER — Encounter: Payer: Self-pay | Admitting: Physician Assistant

## 2024-05-30 DIAGNOSIS — F411 Generalized anxiety disorder: Secondary | ICD-10-CM

## 2024-05-30 MED ORDER — ESCITALOPRAM OXALATE 10 MG PO TABS: 10.0000 mg | ORAL_TABLET | Freq: Every day | ORAL | 2 refills | Status: DC

## 2024-05-30 NOTE — Progress Notes (Signed)
   Subjective: Anxiety    Patient ID: Vanessa Bryan, female    DOB: 02-07-1976, 48 y.o.   MRN: [id number]  HPI This is a 2-week follow-up for anxiety.  Patient became refractory to BuSpar .  Medication was changed Lexapro  10 mg.  Patient states in 4 days she noticed moderate improvement.  Denies any side effects of medication.   Review of Systems Allergic rhinitis, fatigue, hypertension, and migraine headaches.    Objective:   Physical Exam Vital signs not taken.       Assessment & Plan: Anxiety   Patient appears to be well with 10 mg of Lexapro  for anxiety.  Will continue prescription follow-up if condition worsens.

## 2024-07-10 ENCOUNTER — Encounter: Payer: Self-pay | Admitting: Physician Assistant

## 2024-07-10 ENCOUNTER — Other Ambulatory Visit: Payer: Self-pay | Admitting: Physician Assistant

## 2024-07-11 ENCOUNTER — Encounter: Payer: Self-pay | Admitting: Physician Assistant

## 2024-07-11 ENCOUNTER — Ambulatory Visit: Payer: Self-pay | Admitting: Physician Assistant

## 2024-07-11 VITALS — BP 133/93 | HR 81 | Resp 16 | Ht 64.0 in | Wt 180.0 lb

## 2024-07-11 DIAGNOSIS — F411 Generalized anxiety disorder: Secondary | ICD-10-CM

## 2024-07-11 NOTE — Progress Notes (Signed)
° °  Subjective: Anxiety    Patient ID: Vanessa Bryan, female    DOB: [birth year], 48 y.o.   MRN: [id number]  HPI Patient states increased anxiety in the last 3 days.  Patient was prescribed Lexapro  last month and states she knows the improvement in her anxiety level in 2 weeks.  Patient states she not sure what is causing the increased anxiety over the weekend.  There is a history of a recent decreased work member at her job.  Review of Systems Negative rhinitis, fatigue, hypertension, and migraine.    Objective:   Physical Exam  07/11/2024   10:07 AM  BP 133/93  Cuff Size Normal  Pulse Rate 81  Weight 180 lb (81.6 kg)  Height 5' 4 (1.626 m)  Resp 16  SpO2 99 %   BMI: 30.90 kg/m2  BSA: 1.92 m2         Assessment & Plan: Increased anxiety   Advised patient to increase Lexapro  to 20 mg and consult to EAP.  Patient will follow-up in 2 to 3 weeks.

## 2024-07-11 NOTE — Progress Notes (Signed)
 Pt presents today with increased anxiety for the past week. Pt states she was doing great and all the sudden bam. Not as bad but very anxious. Stress levels have not changed.

## 2024-07-11 NOTE — Progress Notes (Signed)
 Given information and pamphlets in how to contact our free EAP for mental health provider visits.  She stated she was unaware  how to use that benefit.

## 2024-07-24 ENCOUNTER — Other Ambulatory Visit: Payer: Self-pay

## 2024-07-24 DIAGNOSIS — E782 Mixed hyperlipidemia: Secondary | ICD-10-CM

## 2024-07-24 MED ORDER — ROSUVASTATIN CALCIUM 40 MG PO TABS: 40.0000 mg | ORAL_TABLET | Freq: Every day | ORAL | 3 refills | Status: AC

## 2024-08-07 ENCOUNTER — Ambulatory Visit: Payer: Self-pay | Admitting: Physician Assistant

## 2024-08-07 ENCOUNTER — Encounter: Payer: Self-pay | Admitting: Physician Assistant

## 2024-08-07 VITALS — BP 136/90 | HR 78 | Temp 96.9°F | Resp 16

## 2024-08-07 DIAGNOSIS — F411 Generalized anxiety disorder: Secondary | ICD-10-CM

## 2024-08-07 MED ORDER — ESCITALOPRAM OXALATE 20 MG PO TABS: 20.0000 mg | ORAL_TABLET | Freq: Every day | ORAL | 3 refills | Status: AC

## 2024-08-07 NOTE — Progress Notes (Signed)
 Stated feels back to 'normal' and taking Lexapro  20mg  daily and tolerating well.

## 2024-08-07 NOTE — Progress Notes (Signed)
" ° °  Subjective: Anxiety    Patient ID: Vanessa Bryan, female    DOB: 08-Apr-1976, 49 y.o.   MRN: [id number]  HPI Patient is follow-up status post increase of medication for anxiety.  Patient was taking Lexapro  at 10 mg and on only a mild decrease in her anxiety.  Patient medication increased to 20 mg 2-1/2 weeks ago and she states remarkable improvement.  States she is able to go about her daily activities without feeling anxious.   Review of Systems Anxiety, hyperlipidemia, and hypertension.    Objective:   Physical Exam BP 136/90  Pulse Rate 78  Temp 96.9 F (36.1 C)  Resp 16  SpO2 98 %  HEENT was unremarkable. Neck is supple heart lymphadenopathy or bruits. Lungs are clear to auscultation. Heart regular rate and rhythm. Patient's appears more relaxed from previous exams.       Assessment & Plan: Anxiety  Generalized anxiety seem to be controlled with Lexapro  20 mg.  Continue medication and follow-up as needed.  "

## 2024-08-08 ENCOUNTER — Ambulatory Visit: Admitting: Physician Assistant
